# Patient Record
Sex: Female | Born: 1965 | Race: White | Hispanic: No | Marital: Married | Smoking: Never smoker
Health system: Southern US, Community
[De-identification: ages and names within clinical notes are randomized; demographics above are authoritative.]

## PROBLEM LIST (undated history)

## (undated) DIAGNOSIS — R112 Nausea with vomiting, unspecified: Secondary | ICD-10-CM

## (undated) DIAGNOSIS — Z9889 Other specified postprocedural states: Secondary | ICD-10-CM

## (undated) DIAGNOSIS — F419 Anxiety disorder, unspecified: Secondary | ICD-10-CM

## (undated) DIAGNOSIS — L309 Dermatitis, unspecified: Secondary | ICD-10-CM

## (undated) HISTORY — DX: Nausea with vomiting, unspecified: R11.2

## (undated) HISTORY — PX: REDUCTION MAMMAPLASTY: SUR839

## (undated) HISTORY — DX: Dermatitis, unspecified: L30.9

## (undated) HISTORY — DX: Other specified postprocedural states: Z98.890

## (undated) HISTORY — DX: Anxiety disorder, unspecified: F41.9

---

## 1988-04-09 HISTORY — PX: BREAST SURGERY: SHX581

## 1999-01-26 ENCOUNTER — Other Ambulatory Visit: Admission: RE | Admit: 1999-01-26 | Discharge: 1999-01-26 | Payer: Self-pay | Admitting: Gynecology

## 2000-03-15 ENCOUNTER — Other Ambulatory Visit: Admission: RE | Admit: 2000-03-15 | Discharge: 2000-03-15 | Payer: Self-pay | Admitting: Obstetrics and Gynecology

## 2001-03-27 ENCOUNTER — Other Ambulatory Visit: Admission: RE | Admit: 2001-03-27 | Discharge: 2001-03-27 | Payer: Self-pay | Admitting: Gynecology

## 2002-04-07 ENCOUNTER — Other Ambulatory Visit: Admission: RE | Admit: 2002-04-07 | Discharge: 2002-04-07 | Payer: Self-pay | Admitting: Gynecology

## 2003-04-19 ENCOUNTER — Other Ambulatory Visit: Admission: RE | Admit: 2003-04-19 | Discharge: 2003-04-19 | Payer: Self-pay | Admitting: Gynecology

## 2003-04-27 ENCOUNTER — Encounter: Admission: RE | Admit: 2003-04-27 | Discharge: 2003-04-27 | Payer: Self-pay | Admitting: Gynecology

## 2004-04-19 ENCOUNTER — Other Ambulatory Visit: Admission: RE | Admit: 2004-04-19 | Discharge: 2004-04-19 | Payer: Self-pay | Admitting: Gynecology

## 2005-04-20 ENCOUNTER — Other Ambulatory Visit: Admission: RE | Admit: 2005-04-20 | Discharge: 2005-04-20 | Payer: Self-pay | Admitting: Gynecology

## 2006-04-30 ENCOUNTER — Other Ambulatory Visit: Admission: RE | Admit: 2006-04-30 | Discharge: 2006-04-30 | Payer: Self-pay | Admitting: Gynecology

## 2007-05-15 ENCOUNTER — Other Ambulatory Visit: Admission: RE | Admit: 2007-05-15 | Discharge: 2007-05-15 | Payer: Self-pay | Admitting: Gynecology

## 2007-07-03 ENCOUNTER — Encounter: Admission: RE | Admit: 2007-07-03 | Discharge: 2007-07-03 | Payer: Self-pay | Admitting: Gynecology

## 2008-05-03 ENCOUNTER — Encounter: Payer: Self-pay | Admitting: Women's Health

## 2008-05-03 ENCOUNTER — Ambulatory Visit: Payer: Self-pay | Admitting: Women's Health

## 2008-05-03 ENCOUNTER — Other Ambulatory Visit: Admission: RE | Admit: 2008-05-03 | Discharge: 2008-05-03 | Payer: Self-pay | Admitting: Gynecology

## 2008-07-09 ENCOUNTER — Encounter: Admission: RE | Admit: 2008-07-09 | Discharge: 2008-07-09 | Payer: Self-pay | Admitting: Gynecology

## 2009-05-13 ENCOUNTER — Other Ambulatory Visit: Admission: RE | Admit: 2009-05-13 | Discharge: 2009-05-13 | Payer: Self-pay | Admitting: Gynecology

## 2009-05-13 ENCOUNTER — Ambulatory Visit: Payer: Self-pay | Admitting: Women's Health

## 2010-03-24 ENCOUNTER — Encounter
Admission: RE | Admit: 2010-03-24 | Discharge: 2010-03-24 | Payer: Self-pay | Source: Home / Self Care | Attending: Gynecology | Admitting: Gynecology

## 2010-04-30 ENCOUNTER — Encounter: Payer: Self-pay | Admitting: Gynecology

## 2010-06-12 ENCOUNTER — Other Ambulatory Visit: Payer: Self-pay | Admitting: Women's Health

## 2010-06-12 ENCOUNTER — Other Ambulatory Visit (HOSPITAL_COMMUNITY)
Admission: RE | Admit: 2010-06-12 | Discharge: 2010-06-12 | Disposition: A | Discharge status: Home / Self Care | Payer: 59 | Source: Ambulatory Visit | Attending: Gynecology | Admitting: Gynecology

## 2010-06-12 ENCOUNTER — Encounter (INDEPENDENT_AMBULATORY_CARE_PROVIDER_SITE_OTHER): Payer: 59 | Admitting: Women's Health

## 2010-06-12 DIAGNOSIS — Z13220 Encounter for screening for lipoid disorders: Secondary | ICD-10-CM

## 2010-06-12 DIAGNOSIS — Z01419 Encounter for gynecological examination (general) (routine) without abnormal findings: Secondary | ICD-10-CM

## 2010-06-12 DIAGNOSIS — Z124 Encounter for screening for malignant neoplasm of cervix: Secondary | ICD-10-CM | POA: Insufficient documentation

## 2010-06-12 DIAGNOSIS — Z833 Family history of diabetes mellitus: Secondary | ICD-10-CM

## 2011-04-19 ENCOUNTER — Other Ambulatory Visit: Payer: Self-pay | Admitting: Obstetrics and Gynecology

## 2011-04-19 DIAGNOSIS — Z1231 Encounter for screening mammogram for malignant neoplasm of breast: Secondary | ICD-10-CM

## 2011-05-11 ENCOUNTER — Ambulatory Visit
Admission: RE | Admit: 2011-05-11 | Discharge: 2011-05-11 | Disposition: A | Discharge status: Home / Self Care | Payer: 59 | Source: Ambulatory Visit | Attending: Obstetrics and Gynecology | Admitting: Obstetrics and Gynecology

## 2011-05-11 DIAGNOSIS — Z1231 Encounter for screening mammogram for malignant neoplasm of breast: Secondary | ICD-10-CM

## 2011-06-20 ENCOUNTER — Ambulatory Visit (INDEPENDENT_AMBULATORY_CARE_PROVIDER_SITE_OTHER): Payer: 59 | Admitting: Women's Health

## 2011-06-20 ENCOUNTER — Other Ambulatory Visit (HOSPITAL_COMMUNITY)
Admission: RE | Admit: 2011-06-20 | Discharge: 2011-06-20 | Disposition: A | Discharge status: Home / Self Care | Payer: 59 | Source: Ambulatory Visit | Attending: Obstetrics and Gynecology | Admitting: Obstetrics and Gynecology

## 2011-06-20 ENCOUNTER — Encounter: Payer: Self-pay | Admitting: Women's Health

## 2011-06-20 VITALS — BP 118/70 | Ht 63.0 in | Wt 143.0 lb

## 2011-06-20 DIAGNOSIS — Z01419 Encounter for gynecological examination (general) (routine) without abnormal findings: Secondary | ICD-10-CM | POA: Insufficient documentation

## 2011-06-20 DIAGNOSIS — Z13220 Encounter for screening for lipoid disorders: Secondary | ICD-10-CM

## 2011-06-20 DIAGNOSIS — Z833 Family history of diabetes mellitus: Secondary | ICD-10-CM

## 2011-06-20 DIAGNOSIS — Z309 Encounter for contraceptive management, unspecified: Secondary | ICD-10-CM

## 2011-06-20 DIAGNOSIS — IMO0001 Reserved for inherently not codable concepts without codable children: Secondary | ICD-10-CM

## 2011-06-20 LAB — CBC WITH DIFFERENTIAL/PLATELET
Basophils Absolute: 0.1 10*3/uL (ref 0.0–0.1)
Basophils Relative: 1 % (ref 0–1)
Eosinophils Absolute: 0.2 10*3/uL (ref 0.0–0.7)
Eosinophils Relative: 2 % (ref 0–5)
HCT: 43.3 % (ref 36.0–46.0)
Hemoglobin: 14.3 g/dL (ref 12.0–15.0)
Lymphocytes Relative: 25 % (ref 12–46)
Lymphs Abs: 2.4 10*3/uL (ref 0.7–4.0)
MCH: 31.3 pg (ref 26.0–34.0)
MCHC: 33 g/dL (ref 30.0–36.0)
MCV: 94.7 fL (ref 78.0–100.0)
Monocytes Absolute: 0.5 10*3/uL (ref 0.1–1.0)
Monocytes Relative: 5 % (ref 3–12)
Neutro Abs: 6.4 10*3/uL (ref 1.7–7.7)
Neutrophils Relative %: 67 % (ref 43–77)
Platelets: 277 10*3/uL (ref 150–400)
RBC: 4.57 MIL/uL (ref 3.87–5.11)
RDW: 12.6 % (ref 11.5–15.5)
WBC: 9.6 10*3/uL (ref 4.0–10.5)

## 2011-06-20 LAB — LIPID PANEL
Cholesterol: 201 mg/dL — ABNORMAL HIGH (ref 0–200)
HDL: 78 mg/dL (ref >39)
LDL Cholesterol: 102 mg/dL — ABNORMAL HIGH (ref 0–99)
Total CHOL/HDL Ratio: 2.6 Ratio
Triglycerides: 106 mg/dL (ref <150)
VLDL: 21 mg/dL (ref 0–40)

## 2011-06-20 LAB — GLUCOSE, RANDOM: Glucose, Bld: 93 mg/dL (ref 70–99)

## 2011-06-20 MED ORDER — NORETHINDRONE ACET-ETHINYL EST 1-20 MG-MCG PO TABS: 1.0000 | ORAL_TABLET | Freq: Every day | ORAL | Status: DC

## 2011-06-20 NOTE — Progress Notes (Signed)
Leslie Tate 44-Jun-1967 [id number]    History:    The patient presents for annual exam.  Light monthly cycle on Loestrin 1/20 without complaint. History of normal Paps and mammograms.   Past medical history, past surgical history, family history and social history were all reviewed and documented in the EPIC chart. Works for [employer].   ROS:  A  ROS was performed and pertinent positives and negatives are included in the history.  Exam:  Filed Vitals:   06/20/11 0810  BP: 118/70    General appearance:  Normal Head/Neck:  Normal, without cervical or supraclavicular adenopathy. Thyroid:  Symmetrical, normal in size, without palpable masses or nodularity. Respiratory  Effort:  Normal  Auscultation:  Clear without wheezing or rhonchi Cardiovascular  Auscultation:  Regular rate, without rubs, murmurs or gallops  Edema/varicosities:  Not grossly evident Abdominal  Soft,nontender, without masses, guarding or rebound.  Liver/spleen:  No organomegaly noted  Hernia:  None appreciated  Skin  Inspection:  Grossly normal  Palpation:  Grossly normal Neurologic/psychiatric  Orientation:  Normal with appropriate conversation.  Mood/affect:  Normal  Genitourinary    Breasts: Examined lying and sitting/breast reduction.     Right: Without masses, retractions, discharge or axillary adenopathy.     Left: Without masses, retractions, discharge or axillary adenopathy.   Inguinal/mons:  Normal without inguinal adenopathy  External genitalia:  Normal  BUS/Urethra/Skene's glands:  Normal  Bladder:  Normal  Vagina:  Normal  Cervix:  Normal  Uterus:  normal in size, shape and contour.  Midline and mobile  Adnexa/parametria:     Rt: Without masses or tenderness.   Lt: Without masses or tenderness.  Anus and perineum: Normal  Digital rectal exam: Normal sphincter tone without palpated masses or tenderness  Assessment/Plan:  46 y.o. MWF G0 for annual exam without complaint.  Normal  GYN exam on Loestrin 1/20.  Plan: Loestrin 1/20 prescription, proper use, slight risk for blood clots and strokes reviewed. SBE's, annual mammogram, calcium rich diet, exercise encouraged. CBC, glucose, lipid profile, UA.  ACOG's recommendation is for Pap screening reviewed.   Harrington Challenger WHNP, 1:48 PM 06/20/2011

## 2011-06-20 NOTE — Patient Instructions (Signed)

## 2011-06-21 LAB — URINALYSIS W MICROSCOPIC + REFLEX CULTURE
Bacteria, UA: NONE SEEN
Bilirubin Urine: NEGATIVE
Casts: NONE SEEN
Crystals: NONE SEEN
Glucose, UA: NEGATIVE mg/dL
Hgb urine dipstick: NEGATIVE
Ketones, ur: NEGATIVE mg/dL
Leukocytes, UA: NEGATIVE
Nitrite: NEGATIVE
Protein, ur: NEGATIVE mg/dL
Specific Gravity, Urine: 1.019 (ref 1.005–1.030)
Squamous Epithelial / HPF: NONE SEEN
Urobilinogen, UA: 0.2 mg/dL (ref 0.0–1.0)
pH: 6 (ref 5.0–8.0)

## 2011-07-11 ENCOUNTER — Other Ambulatory Visit: Payer: Self-pay | Admitting: Women's Health

## 2012-07-11 ENCOUNTER — Other Ambulatory Visit: Payer: Self-pay

## 2012-07-11 ENCOUNTER — Other Ambulatory Visit: Payer: Self-pay | Admitting: Women's Health

## 2012-07-11 DIAGNOSIS — Z1231 Encounter for screening mammogram for malignant neoplasm of breast: Secondary | ICD-10-CM

## 2012-07-17 ENCOUNTER — Encounter: Payer: Self-pay | Admitting: Women's Health

## 2012-07-17 ENCOUNTER — Ambulatory Visit (INDEPENDENT_AMBULATORY_CARE_PROVIDER_SITE_OTHER): Payer: 59 | Admitting: Women's Health

## 2012-07-17 VITALS — BP 108/72 | Ht 63.25 in | Wt 145.0 lb

## 2012-07-17 DIAGNOSIS — Z01419 Encounter for gynecological examination (general) (routine) without abnormal findings: Secondary | ICD-10-CM

## 2012-07-17 DIAGNOSIS — Z309 Encounter for contraceptive management, unspecified: Secondary | ICD-10-CM

## 2012-07-17 DIAGNOSIS — IMO0001 Reserved for inherently not codable concepts without codable children: Secondary | ICD-10-CM

## 2012-07-17 DIAGNOSIS — Z13220 Encounter for screening for lipoid disorders: Secondary | ICD-10-CM

## 2012-07-17 DIAGNOSIS — Z833 Family history of diabetes mellitus: Secondary | ICD-10-CM

## 2012-07-17 LAB — CBC WITH DIFFERENTIAL/PLATELET
Basophils Absolute: 0 K/uL (ref 0.0–0.1)
Basophils Relative: 0 % (ref 0–1)
Eosinophils Absolute: 0.1 K/uL (ref 0.0–0.7)
Eosinophils Relative: 1 % (ref 0–5)
HCT: 40.7 % (ref 36.0–46.0)
Hemoglobin: 13.8 g/dL (ref 12.0–15.0)
Lymphocytes Relative: 25 % (ref 12–46)
Lymphs Abs: 2.5 K/uL (ref 0.7–4.0)
MCH: 31 pg (ref 26.0–34.0)
MCHC: 33.9 g/dL (ref 30.0–36.0)
MCV: 91.5 fL (ref 78.0–100.0)
Monocytes Absolute: 0.3 K/uL (ref 0.1–1.0)
Monocytes Relative: 3 % (ref 3–12)
Neutro Abs: 7 K/uL (ref 1.7–7.7)
Neutrophils Relative %: 71 % (ref 43–77)
Platelets: 269 K/uL (ref 150–400)
RBC: 4.45 MIL/uL (ref 3.87–5.11)
RDW: 13.1 % (ref 11.5–15.5)
WBC: 9.9 K/uL (ref 4.0–10.5)

## 2012-07-17 LAB — LIPID PANEL
Cholesterol: 197 mg/dL (ref 0–200)
HDL: 64 mg/dL
LDL Cholesterol: 111 mg/dL — ABNORMAL HIGH (ref 0–99)
Total CHOL/HDL Ratio: 3.1 ratio
Triglycerides: 110 mg/dL
VLDL: 22 mg/dL (ref 0–40)

## 2012-07-17 LAB — GLUCOSE, RANDOM: Glucose, Bld: 87 mg/dL (ref 70–99)

## 2012-07-17 MED ORDER — NORETHIN ACE-ETH ESTRAD-FE 1-20 MG-MCG PO TABS: 1.0000 | ORAL_TABLET | Freq: Every day | ORAL | Status: DC

## 2012-07-17 NOTE — Progress Notes (Signed)
Leslie Tate [birth year] [id number]    History:    The patient presents for annual exam.  Light monthly cycle on Loestrin 1/20 without complaint. Normal Pap and mammogram history. Mammogram scheduled in May. History of her breast reduction.   Past medical history, past surgical history, family history and social history were all reviewed and documented in the EPIC chart. Works at [employer], has 2 yellow labs. Adopted.   ROS:  A  ROS was performed and pertinent positives and negatives are included in the history.  Exam:  Filed Vitals:   07/17/12 0812  BP: 108/72    General appearance:  Normal Head/Neck:  Normal, without cervical or supraclavicular adenopathy. Thyroid:  Symmetrical, normal in size, without palpable masses or nodularity. Respiratory  Effort:  Normal  Auscultation:  Clear without wheezing or rhonchi Cardiovascular  Auscultation:  Regular rate, without rubs, murmurs or gallops  Edema/varicosities:  Not grossly evident Abdominal  Soft,nontender, without masses, guarding or rebound.  Liver/spleen:  No organomegaly noted  Hernia:  None appreciated  Skin  Inspection:  Grossly normal  Palpation:  Grossly normal Neurologic/psychiatric  Orientation:  Normal with appropriate conversation.  Mood/affect:  Normal  Genitourinary    Breasts: Examined lying and sitting/reduction.     Right: Without masses, retractions, discharge or axillary adenopathy.     Left: Without masses, retractions, discharge or axillary adenopathy.   Inguinal/mons:  Normal without inguinal adenopathy  External genitalia:  Normal  BUS/Urethra/Skene's glands:  Normal  Bladder:  Normal  Vagina:  Normal  Cervix:  Normal  Uterus:   normal in size, shape and contour.  Midline and mobile  Adnexa/parametria:     Rt: Without masses or tenderness.   Lt: Without masses or tenderness.  Anus and perineum: Normal  Digital rectal exam: Normal sphincter tone without palpated masses or  tenderness  Assessment/Plan:  47 y.o. MWF G0 for annual exam with no complaints.  Normal GYN exam on Loestrin  Plan: Loestrin 1/20 prescription, proper use, slight risk for blood clots and strokes reviewed. SBE's, continue annual mammogram, calcium rich diet, continue active lifestyle walking and using elliptical, vitamin D 1000 daily encouraged. CBC, glucose, UA.  Pap normal 2013, new screening guidelines reviewed.    Harrington Challenger [facility], 8:53 AM 07/17/2012

## 2012-07-17 NOTE — Patient Instructions (Signed)

## 2012-07-18 LAB — URINALYSIS W MICROSCOPIC + REFLEX CULTURE
Bilirubin Urine: NEGATIVE
Casts: NONE SEEN
Crystals: NONE SEEN
Glucose, UA: NEGATIVE mg/dL
Hgb urine dipstick: NEGATIVE
Ketones, ur: NEGATIVE mg/dL
Leukocytes, UA: NEGATIVE
Nitrite: NEGATIVE
Protein, ur: NEGATIVE mg/dL
Specific Gravity, Urine: <1.005 — ABNORMAL LOW (ref 1.005–1.030)
Squamous Epithelial / HPF: NONE SEEN
Urobilinogen, UA: 0.2 mg/dL (ref 0.0–1.0)
pH: 6.5 (ref 5.0–8.0)

## 2012-08-08 ENCOUNTER — Ambulatory Visit: Payer: 59

## 2012-08-29 ENCOUNTER — Ambulatory Visit
Admission: RE | Admit: 2012-08-29 | Discharge: 2012-08-29 | Disposition: A | Discharge status: Home / Self Care | Payer: 59 | Source: Ambulatory Visit

## 2012-08-29 DIAGNOSIS — Z1231 Encounter for screening mammogram for malignant neoplasm of breast: Secondary | ICD-10-CM

## 2012-12-23 ENCOUNTER — Other Ambulatory Visit: Payer: Self-pay | Admitting: Women's Health

## 2013-09-02 ENCOUNTER — Encounter: Payer: Self-pay | Admitting: Women's Health

## 2013-09-02 ENCOUNTER — Ambulatory Visit (INDEPENDENT_AMBULATORY_CARE_PROVIDER_SITE_OTHER): Payer: 59 | Admitting: Women's Health

## 2013-09-02 VITALS — BP 102/70 | Ht 63.0 in | Wt 146.0 lb

## 2013-09-02 DIAGNOSIS — Z833 Family history of diabetes mellitus: Secondary | ICD-10-CM

## 2013-09-02 DIAGNOSIS — Z309 Encounter for contraceptive management, unspecified: Secondary | ICD-10-CM

## 2013-09-02 DIAGNOSIS — Z13220 Encounter for screening for lipoid disorders: Secondary | ICD-10-CM

## 2013-09-02 DIAGNOSIS — Z01419 Encounter for gynecological examination (general) (routine) without abnormal findings: Secondary | ICD-10-CM

## 2013-09-02 DIAGNOSIS — IMO0001 Reserved for inherently not codable concepts without codable children: Secondary | ICD-10-CM

## 2013-09-02 LAB — CBC WITH DIFFERENTIAL/PLATELET
Basophils Absolute: 0 10*3/uL (ref 0.0–0.1)
Basophils Relative: 0 % (ref 0–1)
Eosinophils Absolute: 0.2 10*3/uL (ref 0.0–0.7)
Eosinophils Relative: 2 % (ref 0–5)
HCT: 41.6 % (ref 36.0–46.0)
Hemoglobin: 14.1 g/dL (ref 12.0–15.0)
Lymphocytes Relative: 27 % (ref 12–46)
Lymphs Abs: 2.4 10*3/uL (ref 0.7–4.0)
MCH: 30.9 pg (ref 26.0–34.0)
MCHC: 33.9 g/dL (ref 30.0–36.0)
MCV: 91.2 fL (ref 78.0–100.0)
Monocytes Absolute: 0.4 10*3/uL (ref 0.1–1.0)
Monocytes Relative: 5 % (ref 3–12)
Neutro Abs: 5.9 10*3/uL (ref 1.7–7.7)
Neutrophils Relative %: 66 % (ref 43–77)
Platelets: 298 10*3/uL (ref 150–400)
RBC: 4.56 MIL/uL (ref 3.87–5.11)
RDW: 13.1 % (ref 11.5–15.5)
WBC: 8.9 10*3/uL (ref 4.0–10.5)

## 2013-09-02 MED ORDER — NORETHIN ACE-ETH ESTRAD-FE 1-20 MG-MCG PO TABS: ORAL_TABLET | ORAL | Status: DC

## 2013-09-02 NOTE — Patient Instructions (Signed)

## 2013-09-02 NOTE — Progress Notes (Signed)
Leslie Tate 30-Mar-1966 [id number]    History:    Presents for annual exam.  Regular monthly cycle on Loestrin. Normal Pap and mammogram history.  Past medical history, past surgical history, family history and social history were all reviewed and documented in the EPIC chart. Adopted, unknown family history. Works for [employer]. Has 2 labs.  ROS:  A  12 point ROS was performed and pertinent positives and negatives are included.  Exam:  Filed Vitals:   09/02/13 0807  BP: 102/70    General appearance:  Normal Thyroid:  Symmetrical, normal in size, without palpable masses or nodularity. Respiratory  Auscultation:  Clear without wheezing or rhonchi Cardiovascular  Auscultation:  Regular rate, without rubs, murmurs or gallops  Edema/varicosities:  Not grossly evident Abdominal  Soft,nontender, without masses, guarding or rebound.  Liver/spleen:  No organomegaly noted  Hernia:  None appreciated  Skin  Inspection:  Grossly normal   Breasts: Examined lying and sitting/bilateral breast reduction.     Right: Without masses, retractions, discharge or axillary adenopathy.     Left: Without masses, retractions, discharge or axillary adenopathy. Gentitourinary   Inguinal/mons:  Normal without inguinal adenopathy  External genitalia:  Normal  BUS/Urethra/Skene's glands:  Normal  Vagina:  Normal  Cervix:  Normal  Uterus:   normal in size, shape and contour.  Midline and mobile  Adnexa/parametria:     Rt: Without masses or tenderness.   Lt: Without masses or tenderness.  Anus and perineum: Normal  Digital rectal exam: Normal sphincter tone without palpated masses or tenderness  Assessment/Plan:  48 y.o. MWF G0 for annual exam with no complaints.  Normal GYN exam on Loestrin Adopted  Plan: Loestrin 1/20 prescription, proper use, slight risk for blood clots and strokes reviewed. SBE's, continue annual mammogram, 3D tomography history of breast reduction. Regular exercise,  calcium rich diet, vitamin D 1000 daily encouraged. CBC, glucose, lipid panel, UA,. Pap normal 2013, new screening guidelines reviewed.  Note: This dictation was prepared with Dragon/digital dictation.  Any transcriptional errors that result are unintentional. [place], 4:15 PM 09/02/2013

## 2013-09-03 LAB — LIPID PANEL
Cholesterol: 192 mg/dL (ref 0–200)
HDL: 64 mg/dL (ref >39)
LDL Cholesterol: 96 mg/dL (ref 0–99)
Total CHOL/HDL Ratio: 3 Ratio
Triglycerides: 159 mg/dL — ABNORMAL HIGH (ref <150)
VLDL: 32 mg/dL (ref 0–40)

## 2013-09-03 LAB — URINALYSIS W MICROSCOPIC + REFLEX CULTURE
Bacteria, UA: NONE SEEN
Bilirubin Urine: NEGATIVE
Casts: NONE SEEN
Crystals: NONE SEEN
Glucose, UA: NEGATIVE mg/dL
Hgb urine dipstick: NEGATIVE
Ketones, ur: NEGATIVE mg/dL
Leukocytes, UA: NEGATIVE
Nitrite: NEGATIVE
Protein, ur: NEGATIVE mg/dL
Specific Gravity, Urine: <1.005 — ABNORMAL LOW (ref 1.005–1.030)
Squamous Epithelial / HPF: NONE SEEN
Urobilinogen, UA: 0.2 mg/dL (ref 0.0–1.0)
pH: 6.5 (ref 5.0–8.0)

## 2013-09-03 LAB — GLUCOSE, RANDOM: Glucose, Bld: 100 mg/dL — ABNORMAL HIGH (ref 70–99)

## 2013-09-11 ENCOUNTER — Other Ambulatory Visit: Payer: Self-pay | Admitting: Women's Health

## 2013-10-12 ENCOUNTER — Other Ambulatory Visit: Payer: Self-pay

## 2013-10-12 DIAGNOSIS — Z1231 Encounter for screening mammogram for malignant neoplasm of breast: Secondary | ICD-10-CM

## 2013-10-29 ENCOUNTER — Encounter (INDEPENDENT_AMBULATORY_CARE_PROVIDER_SITE_OTHER): Payer: Self-pay

## 2013-10-29 ENCOUNTER — Ambulatory Visit
Admission: RE | Admit: 2013-10-29 | Discharge: 2013-10-29 | Disposition: A | Discharge status: Home / Self Care | Payer: 59 | Source: Ambulatory Visit

## 2013-10-29 DIAGNOSIS — Z1231 Encounter for screening mammogram for malignant neoplasm of breast: Secondary | ICD-10-CM

## 2014-09-15 ENCOUNTER — Encounter: Payer: Self-pay | Admitting: Women's Health

## 2014-09-15 ENCOUNTER — Other Ambulatory Visit (HOSPITAL_COMMUNITY)
Admission: RE | Admit: 2014-09-15 | Discharge: 2014-09-15 | Disposition: A | Discharge status: Home / Self Care | Payer: 59 | Source: Ambulatory Visit | Attending: Women's Health | Admitting: Women's Health

## 2014-09-15 ENCOUNTER — Ambulatory Visit (INDEPENDENT_AMBULATORY_CARE_PROVIDER_SITE_OTHER): Payer: 59 | Admitting: Women's Health

## 2014-09-15 VITALS — BP 110/72 | Ht 63.0 in | Wt 142.6 lb

## 2014-09-15 DIAGNOSIS — Z13220 Encounter for screening for lipoid disorders: Secondary | ICD-10-CM

## 2014-09-15 DIAGNOSIS — Z1151 Encounter for screening for human papillomavirus (HPV): Secondary | ICD-10-CM | POA: Diagnosis present

## 2014-09-15 DIAGNOSIS — Z01419 Encounter for gynecological examination (general) (routine) without abnormal findings: Secondary | ICD-10-CM | POA: Insufficient documentation

## 2014-09-15 DIAGNOSIS — Z3041 Encounter for surveillance of contraceptive pills: Secondary | ICD-10-CM

## 2014-09-15 MED ORDER — NORETHIN ACE-ETH ESTRAD-FE 1-20 MG-MCG PO TABS: ORAL_TABLET | ORAL | Status: DC

## 2014-09-15 NOTE — Patient Instructions (Signed)

## 2014-09-15 NOTE — Addendum Note (Signed)
Addended by: Thamas Jaegers on: 09/15/2014 08:49 AM   Modules accepted: Orders

## 2014-09-15 NOTE — Progress Notes (Signed)
Leslie Tate 1965-08-23 [id number]    History:    Presents for annual exam.  Light monthly cycle on Loestrin. Normal Pap and mammogram history has had 3-D mammograms.  Past medical history, past surgical history, family history and social history were all reviewed and documented in the EPIC chart. Adopted unknown family history. Works at [employer] in [place]. 2 labs.  ROS:  A ROS was performed and pertinent positives and negatives are included.  Exam:  Filed Vitals:   09/15/14 0813  BP: 110/72    General appearance:  Normal Thyroid:  Symmetrical, normal in size, without palpable masses or nodularity. Respiratory  Auscultation:  Clear without wheezing or rhonchi Cardiovascular  Auscultation:  Regular rate, without rubs, murmurs or gallops  Edema/varicosities:  Not grossly evident Abdominal  Soft,nontender, without masses, guarding or rebound.  Liver/spleen:  No organomegaly noted  Hernia:  None appreciated  Skin  Inspection:  Grossly normal   Breasts: Examined lying and sitting.     Right: Without masses, retractions, discharge or axillary adenopathy.     Left: Without masses, retractions, discharge or axillary adenopathy. Gentitourinary   Inguinal/mons:  Normal without inguinal adenopathy  External genitalia:  Normal  BUS/Urethra/Skene's glands:  Normal  Vagina:  Normal  Cervix:  Normal  Uterus:   normal in size, shape and contour.  Midline and mobile  Adnexa/parametria:     Rt: Without masses or tenderness.   Lt: Without masses or tenderness.  Anus and perineum: Normal  Digital rectal exam: Normal sphincter tone without palpated masses or tenderness  Assessment/Plan:  49 y.o. MWF G0 for annual exam with no complaints.  Light monthly cycle on Loestrin.  Plan: Loestrin 1/20 prescription, proper use, slight risk for blood clots and strokes. SBE's, continue annual 3-D screening mammogram. Continue healthy lifestyle of diet and exercise, calcium rich diet,  vitamin D 1000 daily encouraged. CBC, CMP, lipid panel, UA, Pap with HR HPV typing, new screening guidelines reviewed.  Huel Cote [facility], 8:41 AM 09/15/2014

## 2014-09-15 NOTE — Addendum Note (Signed)
Addended by: Thamas Jaegers on: 09/15/2014 08:47 AM   Modules accepted: Orders

## 2014-09-16 LAB — CBC WITH DIFFERENTIAL/PLATELET
BASOS PCT: 1 % (ref 0–1)
Basophils Absolute: 0.1 10*3/uL (ref 0.0–0.1)
Eosinophils Absolute: 0.2 10*3/uL (ref 0.0–0.7)
Eosinophils Relative: 3 % (ref 0–5)
HCT: 41.9 % (ref 36.0–46.0)
Hemoglobin: 13.8 g/dL (ref 12.0–15.0)
Lymphocytes Relative: 31 % (ref 12–46)
Lymphs Abs: 2.4 10*3/uL (ref 0.7–4.0)
MCH: 30.6 pg (ref 26.0–34.0)
MCHC: 32.9 g/dL (ref 30.0–36.0)
MCV: 92.9 fL (ref 78.0–100.0)
MONOS PCT: 5 % (ref 3–12)
MPV: 9.9 fL (ref 8.6–12.4)
Monocytes Absolute: 0.4 10*3/uL (ref 0.1–1.0)
Neutro Abs: 4.6 10*3/uL (ref 1.7–7.7)
Neutrophils Relative %: 60 % (ref 43–77)
Platelets: 272 10*3/uL (ref 150–400)
RBC: 4.51 MIL/uL (ref 3.87–5.11)
RDW: 12.7 % (ref 11.5–15.5)
WBC: 7.7 10*3/uL (ref 4.0–10.5)

## 2014-09-16 LAB — CYTOLOGY - PAP

## 2014-09-16 LAB — URINALYSIS W MICROSCOPIC + REFLEX CULTURE
Bacteria, UA: NONE SEEN
Bilirubin Urine: NEGATIVE
CASTS: NONE SEEN
CRYSTALS: NONE SEEN
GLUCOSE, UA: NEGATIVE mg/dL
HGB URINE DIPSTICK: NEGATIVE
Ketones, ur: NEGATIVE mg/dL
Leukocytes, UA: NEGATIVE
Nitrite: NEGATIVE
PH: 6 (ref 5.0–8.0)
PROTEIN: NEGATIVE mg/dL
SPECIFIC GRAVITY, URINE: 1.009 (ref 1.005–1.030)
Squamous Epithelial / HPF: NONE SEEN
Urobilinogen, UA: 0.2 mg/dL (ref 0.0–1.0)

## 2014-09-17 LAB — LIPID PANEL
CHOL/HDL RATIO: 3 ratio
Cholesterol: 190 mg/dL (ref 0–200)
HDL: 63 mg/dL (ref >=46)
LDL CALC: 104 mg/dL — AB (ref 0–99)
Triglycerides: 115 mg/dL (ref <150)
VLDL: 23 mg/dL (ref 0–40)

## 2014-09-17 LAB — COMPREHENSIVE METABOLIC PANEL
ALT: 20 U/L (ref 0–35)
AST: 19 U/L (ref 0–37)
Albumin: 4.2 g/dL (ref 3.5–5.2)
Alkaline Phosphatase: 51 U/L (ref 39–117)
BUN: 11 mg/dL (ref 6–23)
CO2: 25 mEq/L (ref 19–32)
Calcium: 9 mg/dL (ref 8.4–10.5)
Chloride: 105 mEq/L (ref 96–112)
Creat: 0.63 mg/dL (ref 0.50–1.10)
Glucose, Bld: 85 mg/dL (ref 70–99)
Potassium: 4.4 mEq/L (ref 3.5–5.3)
Sodium: 137 mEq/L (ref 135–145)
Total Bilirubin: 1 mg/dL (ref 0.2–1.2)
Total Protein: 6.7 g/dL (ref 6.0–8.3)

## 2014-10-04 ENCOUNTER — Other Ambulatory Visit: Payer: Self-pay

## 2014-10-04 DIAGNOSIS — Z1231 Encounter for screening mammogram for malignant neoplasm of breast: Secondary | ICD-10-CM

## 2014-11-05 ENCOUNTER — Other Ambulatory Visit: Payer: Self-pay

## 2014-11-05 DIAGNOSIS — Z3041 Encounter for surveillance of contraceptive pills: Secondary | ICD-10-CM

## 2014-11-05 MED ORDER — NORETHIN ACE-ETH ESTRAD-FE 1-20 MG-MCG PO TABS: ORAL_TABLET | ORAL | Status: DC

## 2014-11-09 ENCOUNTER — Ambulatory Visit
Admission: RE | Admit: 2014-11-09 | Discharge: 2014-11-09 | Disposition: A | Discharge status: Home / Self Care | Payer: 59 | Source: Ambulatory Visit

## 2014-11-09 DIAGNOSIS — Z1231 Encounter for screening mammogram for malignant neoplasm of breast: Secondary | ICD-10-CM

## 2015-03-30 ENCOUNTER — Ambulatory Visit: Payer: 59

## 2015-09-29 ENCOUNTER — Other Ambulatory Visit: Payer: Self-pay

## 2015-09-29 DIAGNOSIS — Z3041 Encounter for surveillance of contraceptive pills: Secondary | ICD-10-CM

## 2015-09-29 MED ORDER — NORETHIN ACE-ETH ESTRAD-FE 1-20 MG-MCG PO TABS: ORAL_TABLET | ORAL | Status: DC

## 2015-10-26 ENCOUNTER — Encounter: Payer: Self-pay | Admitting: Women's Health

## 2015-10-26 ENCOUNTER — Ambulatory Visit (INDEPENDENT_AMBULATORY_CARE_PROVIDER_SITE_OTHER): Payer: 59 | Admitting: Women's Health

## 2015-10-26 VITALS — BP 110/80 | Ht 63.0 in | Wt 133.0 lb

## 2015-10-26 DIAGNOSIS — Z1329 Encounter for screening for other suspected endocrine disorder: Secondary | ICD-10-CM | POA: Diagnosis not present

## 2015-10-26 DIAGNOSIS — Z13220 Encounter for screening for lipoid disorders: Secondary | ICD-10-CM

## 2015-10-26 DIAGNOSIS — Z3041 Encounter for surveillance of contraceptive pills: Secondary | ICD-10-CM

## 2015-10-26 DIAGNOSIS — Z01419 Encounter for gynecological examination (general) (routine) without abnormal findings: Secondary | ICD-10-CM

## 2015-10-26 LAB — CBC WITH DIFFERENTIAL/PLATELET
Basophils Absolute: 0 cells/uL (ref 0–200)
Basophils Relative: 0 %
EOS PCT: 1 %
Eosinophils Absolute: 107 cells/uL (ref 15–500)
HCT: 44.7 % (ref 35.0–45.0)
HEMOGLOBIN: 15 g/dL (ref 11.7–15.5)
Lymphocytes Relative: 16 %
Lymphs Abs: 1712 cells/uL (ref 850–3900)
MCH: 31.4 pg (ref 27.0–33.0)
MCHC: 33.6 g/dL (ref 32.0–36.0)
MCV: 93.5 fL (ref 80.0–100.0)
MONOS PCT: 5 %
MPV: 10.1 fL (ref 7.5–12.5)
Monocytes Absolute: 535 cells/uL (ref 200–950)
NEUTROS ABS: 8346 {cells}/uL — AB (ref 1500–7800)
Neutrophils Relative %: 78 %
PLATELETS: 264 10*3/uL (ref 140–400)
RBC: 4.78 MIL/uL (ref 3.80–5.10)
RDW: 13.2 % (ref 11.0–15.0)
WBC: 10.7 10*3/uL (ref 3.8–10.8)

## 2015-10-26 LAB — COMPREHENSIVE METABOLIC PANEL
ALT: 14 U/L (ref 6–29)
AST: 16 U/L (ref 10–35)
Albumin: 4.3 g/dL (ref 3.6–5.1)
Alkaline Phosphatase: 46 U/L (ref 33–115)
BILIRUBIN TOTAL: 1.3 mg/dL — AB (ref 0.2–1.2)
BUN: 15 mg/dL (ref 7–25)
CHLORIDE: 104 mmol/L (ref 98–110)
CO2: 22 mmol/L (ref 20–31)
Calcium: 9.5 mg/dL (ref 8.6–10.2)
Creat: 0.88 mg/dL (ref 0.50–1.10)
GLUCOSE: 77 mg/dL (ref 65–99)
Potassium: 4.5 mmol/L (ref 3.5–5.3)
Sodium: 138 mmol/L (ref 135–146)
Total Protein: 6.9 g/dL (ref 6.1–8.1)

## 2015-10-26 LAB — LIPID PANEL
Cholesterol: 226 mg/dL — ABNORMAL HIGH (ref 125–200)
HDL: 86 mg/dL (ref >=46)
LDL Cholesterol: 123 mg/dL (ref <130)
Total CHOL/HDL Ratio: 2.6 Ratio (ref <=5.0)
Triglycerides: 83 mg/dL (ref <150)
VLDL: 17 mg/dL (ref <30)

## 2015-10-26 LAB — TSH: TSH: 1.09 m[IU]/L

## 2015-10-26 MED ORDER — NORETHIN ACE-ETH ESTRAD-FE 1-20 MG-MCG PO TABS: ORAL_TABLET | ORAL | Status: DC

## 2015-10-26 NOTE — Progress Notes (Signed)
Leslie Tate [birth year] [id number]    History:    Presents for annual exam.  Light monthly cycle on Loestrin without complaint. Normal Pap and mammogram history. Adopted unknown family history.  Past medical history, past surgical history, family history and social history were all reviewed and documented in the EPIC chart. Works at [employer]. Has 2 labs.  ROS:  A ROS was performed and pertinent positives and negatives are included.  Exam:  Filed Vitals:   10/26/15 0817  BP: 110/80    General appearance:  Normal Thyroid:  Symmetrical, normal in size, without palpable masses or nodularity. Respiratory  Auscultation:  Clear without wheezing or rhonchi Cardiovascular  Auscultation:  Regular rate, without rubs, murmurs or gallops  Edema/varicosities:  Not grossly evident Abdominal  Soft,nontender, without masses, guarding or rebound.  Liver/spleen:  No organomegaly noted  Hernia:  None appreciated  Skin  Inspection:  Grossly normal   Breasts: Examined lying and sitting/history of reduction.     Right: Without masses, retractions, discharge or axillary adenopathy.     Left: Without masses, retractions, discharge or axillary adenopathy. Gentitourinary   Inguinal/mons:  Normal without inguinal adenopathy  External genitalia:  Normal  BUS/Urethra/Skene's glands:  Normal  Vagina:  Normal  Cervix:  Normal  Uterus:   normal in size, shape and contour.  Midline and mobile  Adnexa/parametria:     Rt: Without masses or tenderness.   Lt: Without masses or tenderness.  Anus and perineum: Normal  Digital rectal exam: Normal sphincter tone without palpated masses or tenderness  Assessment/Plan:  50 y.o. MWF G0 for annual exam with no complaints.  Monthly cycle on Loestrin  Plan: Loestrin 1/20 prescription, proper use given and reviewed slight risk for blood clots and strokes. Denies menopausal symptoms. SBE's, continue annual 3-D screening mammogram history of a breast reduction.  Continue active lifestyle of regular exercise, healthy diet. Vitamin D 1000 daily, calcium rich diet encouraged. CBC, lipid panel, CMP, vitamin D, UA, Pap normal with a negative HR HPV 2016, new screening guidelines reviewed.    Huel Cote [facility], 11:35 AM 10/26/2015

## 2015-10-26 NOTE — Patient Instructions (Signed)
Health Maintenance, Female Adopting a healthy lifestyle and getting preventive care can go a long way to promote health and wellness. Talk with your health care provider about what schedule of regular examinations is right for you. This is a good chance for you to check in with your provider about disease prevention and staying healthy. In between checkups, there are plenty of things you can do on your own. Experts have done a lot of research about which lifestyle changes and preventive measures are most likely to keep you healthy. Ask your health care provider for more information. WEIGHT AND DIET  Eat a healthy diet  Be sure to include plenty of vegetables, fruits, low-fat dairy products, and lean protein.  Do not eat a lot of foods high in solid fats, added sugars, or salt.  Get regular exercise. This is one of the most important things you can do for your health.  Most adults should exercise for at least 150 minutes each week. The exercise should increase your heart rate and make you sweat (moderate-intensity exercise).  Most adults should also do strengthening exercises at least twice a week. This is in addition to the moderate-intensity exercise.  Maintain a healthy weight  Body mass index (BMI) is a measurement that can be used to identify possible weight problems. It estimates body fat based on height and weight. Your health care provider can help determine your BMI and help you achieve or maintain a healthy weight.  For females 20 years of age and older:   A BMI below 18.5 is considered underweight.  A BMI of 18.5 to 24.9 is normal.  A BMI of 25 to 29.9 is considered overweight.  A BMI of 30 and above is considered obese.  Watch levels of cholesterol and blood lipids  You should start having your blood tested for lipids and cholesterol at 48 years of age, then have this test every 5 years.  You may need to have your cholesterol levels checked more often if:  Your lipid  or cholesterol levels are high.  You are older than 48 years of age.  You are at high risk for heart disease.  CANCER SCREENING   Lung Cancer  Lung cancer screening is recommended for adults 55-80 years old who are at high risk for lung cancer because of a history of smoking.  A yearly low-dose CT scan of the lungs is recommended for people who:  Currently smoke.  Have quit within the past 15 years.  Have at least a 30-pack-year history of smoking. A pack year is smoking an average of one pack of cigarettes a day for 1 year.  Yearly screening should continue until it has been 15 years since you quit.  Yearly screening should stop if you develop a health problem that would prevent you from having lung cancer treatment.  Breast Cancer  Practice breast self-awareness. This means understanding how your breasts normally appear and feel.  It also means doing regular breast self-exams. Let your health care provider know about any changes, no matter how small.  If you are in your 20s or 30s, you should have a clinical breast exam (CBE) by a health care provider every 1-3 years as part of a regular health exam.  If you are 40 or older, have a CBE every year. Also consider having a breast X-ray (mammogram) every year.  If you have a family history of breast cancer, talk to your health care provider about genetic screening.  If you   are at high risk for breast cancer, talk to your health care provider about having an MRI and a mammogram every year.  Breast cancer gene (BRCA) assessment is recommended for women who have family members with BRCA-related cancers. BRCA-related cancers include:  Breast.  Ovarian.  Tubal.  Peritoneal cancers.  Results of the assessment will determine the need for genetic counseling and BRCA1 and BRCA2 testing. Cervical Cancer Your health care provider may recommend that you be screened regularly for cancer of the pelvic organs (ovaries, uterus, and  vagina). This screening involves a pelvic examination, including checking for microscopic changes to the surface of your cervix (Pap test). You may be encouraged to have this screening done every 3 years, beginning at age 21.  For women ages 30-65, health care providers may recommend pelvic exams and Pap testing every 3 years, or they may recommend the Pap and pelvic exam, combined with testing for human papilloma virus (HPV), every 5 years. Some types of HPV increase your risk of cervical cancer. Testing for HPV may also be done on women of any age with unclear Pap test results.  Other health care providers may not recommend any screening for nonpregnant women who are considered low risk for pelvic cancer and who do not have symptoms. Ask your health care provider if a screening pelvic exam is right for you.  If you have had past treatment for cervical cancer or a condition that could lead to cancer, you need Pap tests and screening for cancer for at least 20 years after your treatment. If Pap tests have been discontinued, your risk factors (such as having a new sexual partner) need to be reassessed to determine if screening should resume. Some women have medical problems that increase the chance of getting cervical cancer. In these cases, your health care provider may recommend more frequent screening and Pap tests. Colorectal Cancer  This type of cancer can be detected and often prevented.  Routine colorectal cancer screening usually begins at 48 years of age and continues through 48 years of age.  Your health care provider may recommend screening at an earlier age if you have risk factors for colon cancer.  Your health care provider may also recommend using home test kits to check for hidden blood in the stool.  A small camera at the end of a tube can be used to examine your colon directly (sigmoidoscopy or colonoscopy). This is done to check for the earliest forms of colorectal  cancer.  Routine screening usually begins at age 50.  Direct examination of the colon should be repeated every 5-10 years through 48 years of age. However, you may need to be screened more often if early forms of precancerous polyps or small growths are found. Skin Cancer  Check your skin from head to toe regularly.  Tell your health care provider about any new moles or changes in moles, especially if there is a change in a mole's shape or color.  Also tell your health care provider if you have a mole that is larger than the size of a pencil eraser.  Always use sunscreen. Apply sunscreen liberally and repeatedly throughout the day.  Protect yourself by wearing long sleeves, pants, a wide-brimmed hat, and sunglasses whenever you are outside. HEART DISEASE, DIABETES, AND HIGH BLOOD PRESSURE   High blood pressure causes heart disease and increases the risk of stroke. High blood pressure is more likely to develop in:  People who have blood pressure in the high end   of the normal range (130-139/85-89 mm Hg).  People who are overweight or obese.  People who are African American.  If you are 38-23 years of age, have your blood pressure checked every 3-5 years. If you are 61 years of age or older, have your blood pressure checked every year. You should have your blood pressure measured twice--once when you are at a hospital or clinic, and once when you are not at a hospital or clinic. Record the average of the two measurements. To check your blood pressure when you are not at a hospital or clinic, you can use:  An automated blood pressure machine at a pharmacy.  A home blood pressure monitor.  If you are between 45 years and 39 years old, ask your health care provider if you should take aspirin to prevent strokes.  Have regular diabetes screenings. This involves taking a blood sample to check your fasting blood sugar level.  If you are at a normal weight and have a low risk for diabetes,  have this test once every three years after 48 years of age.  If you are overweight and have a high risk for diabetes, consider being tested at a younger age or more often. PREVENTING INFECTION  Hepatitis B  If you have a higher risk for hepatitis B, you should be screened for this virus. You are considered at high risk for hepatitis B if:  You were born in a country where hepatitis B is common. Ask your health care provider which countries are considered high risk.  Your parents were born in a high-risk country, and you have not been immunized against hepatitis B (hepatitis B vaccine).  You have HIV or AIDS.  You use needles to inject street drugs.  You live with someone who has hepatitis B.  You have had sex with someone who has hepatitis B.  You get hemodialysis treatment.  You take certain medicines for conditions, including cancer, organ transplantation, and autoimmune conditions. Hepatitis C  Blood testing is recommended for:  Everyone born from [birth year] through 1963.  Anyone with known risk factors for hepatitis C. Sexually transmitted infections (STIs)  You should be screened for sexually transmitted infections (STIs) including gonorrhea and chlamydia if:  You are sexually active and are younger than 48 years of age.  You are older than 48 years of age and your health care provider tells you that you are at risk for this type of infection.  Your sexual activity has changed since you were last screened and you are at an increased risk for chlamydia or gonorrhea. Ask your health care provider if you are at risk.  If you do not have HIV, but are at risk, it may be recommended that you take a prescription medicine daily to prevent HIV infection. This is called pre-exposure prophylaxis (PrEP). You are considered at risk if:  You are sexually active and do not regularly use condoms or know the HIV status of your partner(s).  You take drugs by injection.  You are sexually  active with a partner who has HIV. Talk with your health care provider about whether you are at high risk of being infected with HIV. If you choose to begin PrEP, you should first be tested for HIV. You should then be tested every 3 months for as long as you are taking PrEP.  PREGNANCY   If you are premenopausal and you may become pregnant, ask your health care provider about preconception counseling.  If you may  become pregnant, take 400 to 800 micrograms (mcg) of folic acid every day.  If you want to prevent pregnancy, talk to your health care provider about birth control (contraception). OSTEOPOROSIS AND MENOPAUSE   Osteoporosis is a disease in which the bones lose minerals and strength with aging. This can result in serious bone fractures. Your risk for osteoporosis can be identified using a bone density scan.  If you are 61 years of age or older, or if you are at risk for osteoporosis and fractures, ask your health care provider if you should be screened.  Ask your health care provider whether you should take a calcium or vitamin D supplement to lower your risk for osteoporosis.  Menopause may have certain physical symptoms and risks.  Hormone replacement therapy may reduce some of these symptoms and risks. Talk to your health care provider about whether hormone replacement therapy is right for you.  HOME CARE INSTRUCTIONS   Schedule regular health, dental, and eye exams.  Stay current with your immunizations.   Do not use any tobacco products including cigarettes, chewing tobacco, or electronic cigarettes.  If you are pregnant, do not drink alcohol.  If you are breastfeeding, limit how much and how often you drink alcohol.  Limit alcohol intake to no more than 1 drink per day for nonpregnant women. One drink equals 12 ounces of beer, 5 ounces of wine, or 1 ounces of hard liquor.  Do not use street drugs.  Do not share needles.  Ask your health care provider for help if  you need support or information about quitting drugs.  Tell your health care provider if you often feel depressed.  Tell your health care provider if you have ever been abused or do not feel safe at home.   This information is not intended to replace advice given to you by your health care provider. Make sure you discuss any questions you have with your health care provider.   Document Released: 10/09/2010 Document Revised: 04/16/2014 Document Reviewed: 02/25/2013 Elsevier Interactive Patient Education [employer].

## 2015-10-27 LAB — URINALYSIS W MICROSCOPIC + REFLEX CULTURE
BILIRUBIN URINE: NEGATIVE
Crystals: NONE SEEN [HPF]
Glucose, UA: NEGATIVE
Hgb urine dipstick: NEGATIVE
Ketones, ur: NEGATIVE
Leukocytes, UA: NEGATIVE
Nitrite: NEGATIVE
Specific Gravity, Urine: 1.027 (ref 1.001–1.035)
YEAST: NONE SEEN [HPF]
pH: 5.5 (ref 5.0–8.0)

## 2015-10-27 LAB — VITAMIN D 25 HYDROXY (VIT D DEFICIENCY, FRACTURES): VIT D 25 HYDROXY: 35 ng/mL (ref 30–100)

## 2015-10-28 ENCOUNTER — Other Ambulatory Visit: Payer: Self-pay | Admitting: Women's Health

## 2015-10-28 DIAGNOSIS — Z1231 Encounter for screening mammogram for malignant neoplasm of breast: Secondary | ICD-10-CM

## 2015-10-28 DIAGNOSIS — Z9889 Other specified postprocedural states: Secondary | ICD-10-CM

## 2015-10-28 LAB — URINE CULTURE: ORGANISM ID, BACTERIA: NO GROWTH

## 2015-11-10 ENCOUNTER — Ambulatory Visit
Admission: RE | Admit: 2015-11-10 | Discharge: 2015-11-10 | Disposition: A | Discharge status: Home / Self Care | Payer: 59 | Source: Ambulatory Visit | Attending: Women's Health | Admitting: Women's Health

## 2015-11-10 DIAGNOSIS — Z1231 Encounter for screening mammogram for malignant neoplasm of breast: Secondary | ICD-10-CM

## 2015-11-10 DIAGNOSIS — Z9889 Other specified postprocedural states: Secondary | ICD-10-CM

## 2016-08-22 ENCOUNTER — Encounter: Payer: Self-pay | Admitting: Gynecology

## 2016-10-12 ENCOUNTER — Other Ambulatory Visit: Payer: Self-pay | Admitting: Women's Health

## 2016-10-12 DIAGNOSIS — Z1231 Encounter for screening mammogram for malignant neoplasm of breast: Secondary | ICD-10-CM

## 2016-10-30 ENCOUNTER — Encounter: Payer: Self-pay | Admitting: Women's Health

## 2016-10-30 ENCOUNTER — Ambulatory Visit (INDEPENDENT_AMBULATORY_CARE_PROVIDER_SITE_OTHER): Payer: 59 | Admitting: Women's Health

## 2016-10-30 VITALS — BP 118/76 | Ht 63.0 in | Wt 135.0 lb

## 2016-10-30 DIAGNOSIS — Z3041 Encounter for surveillance of contraceptive pills: Secondary | ICD-10-CM

## 2016-10-30 DIAGNOSIS — Z01419 Encounter for gynecological examination (general) (routine) without abnormal findings: Secondary | ICD-10-CM

## 2016-10-30 DIAGNOSIS — Z13220 Encounter for screening for lipoid disorders: Secondary | ICD-10-CM | POA: Diagnosis not present

## 2016-10-30 LAB — LIPID PANEL
CHOL/HDL RATIO: 2.6 ratio (ref <5.0)
Cholesterol: 225 mg/dL — ABNORMAL HIGH (ref <200)
HDL: 87 mg/dL (ref >50)
LDL CALC: 105 mg/dL — AB (ref <100)
Triglycerides: 164 mg/dL — ABNORMAL HIGH (ref <150)
VLDL: 33 mg/dL — ABNORMAL HIGH (ref <30)

## 2016-10-30 LAB — CBC WITH DIFFERENTIAL/PLATELET
BASOS ABS: 81 {cells}/uL (ref 0–200)
Basophils Relative: 1 %
EOS ABS: 162 {cells}/uL (ref 15–500)
EOS PCT: 2 %
HCT: 43.3 % (ref 35.0–45.0)
Hemoglobin: 14.2 g/dL (ref 11.7–15.5)
Lymphocytes Relative: 30 %
Lymphs Abs: 2430 cells/uL (ref 850–3900)
MCH: 31.3 pg (ref 27.0–33.0)
MCHC: 32.8 g/dL (ref 32.0–36.0)
MCV: 95.4 fL (ref 80.0–100.0)
MONOS PCT: 5 %
MPV: 10 fL (ref 7.5–12.5)
Monocytes Absolute: 405 cells/uL (ref 200–950)
NEUTROS PCT: 62 %
Neutro Abs: 5022 cells/uL (ref 1500–7800)
PLATELETS: 263 10*3/uL (ref 140–400)
RBC: 4.54 MIL/uL (ref 3.80–5.10)
RDW: 13.4 % (ref 11.0–15.0)
WBC: 8.1 10*3/uL (ref 3.8–10.8)

## 2016-10-30 LAB — COMPREHENSIVE METABOLIC PANEL
ALBUMIN: 4.2 g/dL (ref 3.6–5.1)
ALT: 17 U/L (ref 6–29)
AST: 18 U/L (ref 10–35)
Alkaline Phosphatase: 48 U/L (ref 33–115)
BUN: 11 mg/dL (ref 7–25)
CHLORIDE: 102 mmol/L (ref 98–110)
CO2: 21 mmol/L (ref 20–31)
Calcium: 9.1 mg/dL (ref 8.6–10.2)
Creat: 0.77 mg/dL (ref 0.50–1.10)
Glucose, Bld: 86 mg/dL (ref 65–99)
POTASSIUM: 4.4 mmol/L (ref 3.5–5.3)
SODIUM: 136 mmol/L (ref 135–146)
TOTAL PROTEIN: 6.6 g/dL (ref 6.1–8.1)
Total Bilirubin: 1.4 mg/dL — ABNORMAL HIGH (ref 0.2–1.2)

## 2016-10-30 MED ORDER — NORETHIN ACE-ETH ESTRAD-FE 1-20 MG-MCG PO TABS: ORAL_TABLET | ORAL | 4 refills | Status: DC

## 2016-10-30 NOTE — Patient Instructions (Signed)
Colonoscopy  Dr Gessner  [phone]  Health Maintenance, Female Adopting a healthy lifestyle and getting preventive care can go a long way to promote health and wellness. Talk with your health care provider about what schedule of regular examinations is right for you. This is a good chance for you to check in with your provider about disease prevention and staying healthy. In between checkups, there are plenty of things you can do on your own. Experts have done a lot of research about which lifestyle changes and preventive measures are most likely to keep you healthy. Ask your health care provider for more information. Weight and diet Eat a healthy diet  Be sure to include plenty of vegetables, fruits, low-fat dairy products, and lean protein.  Do not eat a lot of foods high in solid fats, added sugars, or salt.  Get regular exercise. This is one of the most important things you can do for your health. ? Most adults should exercise for at least 150 minutes each week. The exercise should increase your heart rate and make you sweat (moderate-intensity exercise). ? Most adults should also do strengthening exercises at least twice a week. This is in addition to the moderate-intensity exercise.  Maintain a healthy weight  Body mass index (BMI) is a measurement that can be used to identify possible weight problems. It estimates body fat based on height and weight. Your health care provider can help determine your BMI and help you achieve or maintain a healthy weight.  For females 20 years of age and older: ? A BMI below 18.5 is considered underweight. ? A BMI of 18.5 to 24.9 is normal. ? A BMI of 25 to 29.9 is considered overweight. ? A BMI of 30 and above is considered obese.  Watch levels of cholesterol and blood lipids  You should start having your blood tested for lipids and cholesterol at 49 years of age, then have this test every 5 years.  You may need to have your cholesterol levels  checked more often if: ? Your lipid or cholesterol levels are high. ? You are older than 50 years of age. ? You are at high risk for heart disease.  Cancer screening Lung Cancer  Lung cancer screening is recommended for adults 55-80 years old who are at high risk for lung cancer because of a history of smoking.  A yearly low-dose CT scan of the lungs is recommended for people who: ? Currently smoke. ? Have quit within the past 15 years. ? Have at least a 30-pack-year history of smoking. A pack year is smoking an average of one pack of cigarettes a day for 1 year.  Yearly screening should continue until it has been 15 years since you quit.  Yearly screening should stop if you develop a health problem that would prevent you from having lung cancer treatment.  Breast Cancer  Practice breast self-awareness. This means understanding how your breasts normally appear and feel.  It also means doing regular breast self-exams. Let your health care provider know about any changes, no matter how small.  If you are in your 20s or 30s, you should have a clinical breast exam (CBE) by a health care provider every 1-3 years as part of a regular health exam.  If you are 40 or older, have a CBE every year. Also consider having a breast X-ray (mammogram) every year.  If you have a family history of breast cancer, talk to your health care provider about genetic screening.    If you are at high risk for breast cancer, talk to your health care provider about having an MRI and a mammogram every year.  Breast cancer gene (BRCA) assessment is recommended for women who have family members with BRCA-related cancers. BRCA-related cancers include: ? Breast. ? Ovarian. ? Tubal. ? Peritoneal cancers.  Results of the assessment will determine the need for genetic counseling and BRCA1 and BRCA2 testing.  Cervical Cancer Your health care provider may recommend that you be screened regularly for cancer of the  pelvic organs (ovaries, uterus, and vagina). This screening involves a pelvic examination, including checking for microscopic changes to the surface of your cervix (Pap test). You may be encouraged to have this screening done every 3 years, beginning at age 71.  For women ages 82-65, health care providers may recommend pelvic exams and Pap testing every 3 years, or they may recommend the Pap and pelvic exam, combined with testing for human papilloma virus (HPV), every 5 years. Some types of HPV increase your risk of cervical cancer. Testing for HPV may also be done on women of any age with unclear Pap test results.  Other health care providers may not recommend any screening for nonpregnant women who are considered low risk for pelvic cancer and who do not have symptoms. Ask your health care provider if a screening pelvic exam is right for you.  If you have had past treatment for cervical cancer or a condition that could lead to cancer, you need Pap tests and screening for cancer for at least 20 years after your treatment. If Pap tests have been discontinued, your risk factors (such as having a new sexual partner) need to be reassessed to determine if screening should resume. Some women have medical problems that increase the chance of getting cervical cancer. In these cases, your health care provider may recommend more frequent screening and Pap tests.  Colorectal Cancer  This type of cancer can be detected and often prevented.  Routine colorectal cancer screening usually begins at 49 years of age and continues through 49 years of age.  Your health care provider may recommend screening at an earlier age if you have risk factors for colon cancer.  Your health care provider may also recommend using home test kits to check for hidden blood in the stool.  A small camera at the end of a tube can be used to examine your colon directly (sigmoidoscopy or colonoscopy). This is done to check for the  earliest forms of colorectal cancer.  Routine screening usually begins at age 15.  Direct examination of the colon should be repeated every 5-10 years through 49 years of age. However, you may need to be screened more often if early forms of precancerous polyps or small growths are found.  Skin Cancer  Check your skin from head to toe regularly.  Tell your health care provider about any new moles or changes in moles, especially if there is a change in a mole's shape or color.  Also tell your health care provider if you have a mole that is larger than the size of a pencil eraser.  Always use sunscreen. Apply sunscreen liberally and repeatedly throughout the day.  Protect yourself by wearing long sleeves, pants, a wide-brimmed hat, and sunglasses whenever you are outside.  Heart disease, diabetes, and high blood pressure  High blood pressure causes heart disease and increases the risk of stroke. High blood pressure is more likely to develop in: ? People who have blood  pressure in the high end of the normal range (130-139/85-89 mm Hg). ? People who are overweight or obese. ? People who are African American.  If you are 56-87 years of age, have your blood pressure checked every 3-5 years. If you are 74 years of age or older, have your blood pressure checked every year. You should have your blood pressure measured twice-once when you are at a hospital or clinic, and once when you are not at a hospital or clinic. Record the average of the two measurements. To check your blood pressure when you are not at a hospital or clinic, you can use: ? An automated blood pressure machine at a pharmacy. ? A home blood pressure monitor.  If you are between 69 years and 79 years old, ask your health care provider if you should take aspirin to prevent strokes.  Have regular diabetes screenings. This involves taking a blood sample to check your fasting blood sugar level. ? If you are at a normal weight and  have a low risk for diabetes, have this test once every three years after 49 years of age. ? If you are overweight and have a high risk for diabetes, consider being tested at a younger age or more often. Preventing infection Hepatitis B  If you have a higher risk for hepatitis B, you should be screened for this virus. You are considered at high risk for hepatitis B if: ? You were born in a country where hepatitis B is common. Ask your health care provider which countries are considered high risk. ? Your parents were born in a high-risk country, and you have not been immunized against hepatitis B (hepatitis B vaccine). ? You have HIV or AIDS. ? You use needles to inject street drugs. ? You live with someone who has hepatitis B. ? You have had sex with someone who has hepatitis B. ? You get hemodialysis treatment. ? You take certain medicines for conditions, including cancer, organ transplantation, and autoimmune conditions.  Hepatitis C  Blood testing is recommended for: ? Everyone born from [birth year] through 1963. ? Anyone with known risk factors for hepatitis C.  Sexually transmitted infections (STIs)  You should be screened for sexually transmitted infections (STIs) including gonorrhea and chlamydia if: ? You are sexually active and are younger than 49 years of age. ? You are older than 49 years of age and your health care provider tells you that you are at risk for this type of infection. ? Your sexual activity has changed since you were last screened and you are at an increased risk for chlamydia or gonorrhea. Ask your health care provider if you are at risk.  If you do not have HIV, but are at risk, it may be recommended that you take a prescription medicine daily to prevent HIV infection. This is called pre-exposure prophylaxis (PrEP). You are considered at risk if: ? You are sexually active and do not regularly use condoms or know the HIV status of your partner(s). ? You take drugs by  injection. ? You are sexually active with a partner who has HIV.  Talk with your health care provider about whether you are at high risk of being infected with HIV. If you choose to begin PrEP, you should first be tested for HIV. You should then be tested every 3 months for as long as you are taking PrEP. Pregnancy  If you are premenopausal and you may become pregnant, ask your health care provider about preconception  counseling.  If you may become pregnant, take 400 to 800 micrograms (mcg) of folic acid every day.  If you want to prevent pregnancy, talk to your health care provider about birth control (contraception). Osteoporosis and menopause  Osteoporosis is a disease in which the bones lose minerals and strength with aging. This can result in serious bone fractures. Your risk for osteoporosis can be identified using a bone density scan.  If you are 54 years of age or older, or if you are at risk for osteoporosis and fractures, ask your health care provider if you should be screened.  Ask your health care provider whether you should take a calcium or vitamin D supplement to lower your risk for osteoporosis.  Menopause may have certain physical symptoms and risks.  Hormone replacement therapy may reduce some of these symptoms and risks. Talk to your health care provider about whether hormone replacement therapy is right for you. Follow these instructions at home:  Schedule regular health, dental, and eye exams.  Stay current with your immunizations.  Do not use any tobacco products including cigarettes, chewing tobacco, or electronic cigarettes.  If you are pregnant, do not drink alcohol.  If you are breastfeeding, limit how much and how often you drink alcohol.  Limit alcohol intake to no more than 1 drink per day for nonpregnant women. One drink equals 12 ounces of beer, 5 ounces of wine, or 1 ounces of hard liquor.  Do not use street drugs.  Do not share needles.  Ask  your health care provider for help if you need support or information about quitting drugs.  Tell your health care provider if you often feel depressed.  Tell your health care provider if you have ever been abused or do not feel safe at home. This information is not intended to replace advice given to you by your health care provider. Make sure you discuss any questions you have with your health care provider. Document Released: 10/09/2010 Document Revised: 09/01/2015 Document Reviewed: 12/28/2014 Elsevier Interactive Patient Education  [employer].

## 2016-10-30 NOTE — Progress Notes (Signed)
Leslie Tate [birth year] [id number]    History:    Presents for annual exam.  Amenorrheic on Loestrin 1/20 for the past 2 years. No menopausal symptoms. Normal Pap and mammogram history. Adopted/unknown family history. History of slightly elevated cholesterol.  Past medical history, past surgical history, family history and social history were all reviewed and documented in the EPIC chart. Works at [employer]. Has 2 labs. Return this week from [place] trip with girlfriends.  ROS:  A ROS was performed and pertinent positives and negatives are included.  Exam:  Vitals:   10/30/16 0950  BP: 118/76  Weight: 135 lb (61.2 kg)  Height: 5\' 3"  (1.6 m)   Body mass index is 23.91 kg/m.   General appearance:  Normal Thyroid:  Symmetrical, normal in size, without palpable masses or nodularity. Respiratory  Auscultation:  Clear without wheezing or rhonchi Cardiovascular  Auscultation:  Regular rate, without rubs, murmurs or gallops  Edema/varicosities:  Not grossly evident Abdominal  Soft,nontender, without masses, guarding or rebound.  Liver/spleen:  No organomegaly noted  Hernia:  None appreciated  Skin  Inspection:  Grossly normal   Breasts: Examined lying and sitting.     Right: Without masses, retractions, discharge or axillary adenopathy.     Left: Without masses, retractions, discharge or axillary adenopathy. Gentitourinary   Inguinal/mons:  Normal without inguinal adenopathy  External genitalia:  Normal  BUS/Urethra/Skene's glands:  Normal  Vagina:  Normal  Cervix:  Normal  Uterus:  normal in size, shape and contour.  Midline and mobile  Adnexa/parametria:     Rt: Without masses or tenderness.   Lt: Without masses or tenderness.  Anus and perineum: Normal  Digital rectal exam: Normal sphincter tone without palpated masses or tenderness  Assessment/Plan:  51 y.o. MWF G0  for annual exam with no complaints.  Amenorrheic on Loestrin  Plan: Menopause reviewed,  asymptomatic at this time will check FSH placebo week next year. SBE's, continue annual screening mammogram has scheduled next month. Continue healthy lifestyle routine of healthy diet and regular exercise. CBC, lipid panel, CMP, UA. Pap normal with negative HR HPV 2016, new screening guidelines reviewed.  [place], 1:12 PM 10/30/2016

## 2016-10-31 LAB — URINALYSIS W MICROSCOPIC + REFLEX CULTURE
Bacteria, UA: NONE SEEN [HPF]
Bilirubin Urine: NEGATIVE
Casts: NONE SEEN [LPF]
Crystals: NONE SEEN [HPF]
Glucose, UA: NEGATIVE
Hgb urine dipstick: NEGATIVE
Ketones, ur: NEGATIVE
Leukocytes, UA: NEGATIVE
Nitrite: NEGATIVE
Protein, ur: NEGATIVE
RBC / HPF: NONE SEEN RBC/HPF (ref <=2)
Specific Gravity, Urine: 1.006 (ref 1.001–1.035)
Squamous Epithelial / LPF: NONE SEEN [HPF] (ref <=5)
WBC, UA: NONE SEEN WBC/HPF (ref <=5)
Yeast: NONE SEEN [HPF]
pH: 6.5 (ref 5.0–8.0)

## 2016-11-13 ENCOUNTER — Ambulatory Visit: Payer: 59

## 2016-11-13 ENCOUNTER — Ambulatory Visit
Admission: RE | Admit: 2016-11-13 | Discharge: 2016-11-13 | Disposition: A | Discharge status: Home / Self Care | Payer: 59 | Source: Ambulatory Visit | Attending: Women's Health | Admitting: Women's Health

## 2016-11-13 ENCOUNTER — Other Ambulatory Visit: Payer: Self-pay | Admitting: Women's Health

## 2016-11-13 DIAGNOSIS — Z3041 Encounter for surveillance of contraceptive pills: Secondary | ICD-10-CM

## 2016-11-13 DIAGNOSIS — Z1231 Encounter for screening mammogram for malignant neoplasm of breast: Secondary | ICD-10-CM

## 2016-12-06 ENCOUNTER — Telehealth: Payer: Self-pay | Admitting: *Deleted

## 2016-12-06 NOTE — Telephone Encounter (Signed)
Pt called requesting Xanax Rx, I left message for pt to call.

## 2016-12-06 NOTE — Telephone Encounter (Signed)
Pt called requesting low dose Xanax for anxiety states she had a Rx for this years ago. Please advsie

## 2016-12-10 NOTE — Telephone Encounter (Signed)
Ok xanax .25 mg 1 daily prn, #30    review to use sparingly, not daily addictive.

## 2016-12-11 MED ORDER — ALPRAZOLAM 0.25 MG PO TABS: 0.2500 mg | ORAL_TABLET | Freq: Every evening | ORAL | 0 refills | Status: DC | PRN

## 2016-12-11 NOTE — Telephone Encounter (Signed)
Pt informed, Rx called in.  

## 2017-12-20 ENCOUNTER — Other Ambulatory Visit: Payer: Self-pay | Admitting: Women's Health

## 2017-12-20 DIAGNOSIS — Z1231 Encounter for screening mammogram for malignant neoplasm of breast: Secondary | ICD-10-CM

## 2017-12-25 ENCOUNTER — Other Ambulatory Visit: Payer: Self-pay | Admitting: *Deleted

## 2017-12-25 ENCOUNTER — Ambulatory Visit (INDEPENDENT_AMBULATORY_CARE_PROVIDER_SITE_OTHER): Payer: 59 | Admitting: Women's Health

## 2017-12-25 ENCOUNTER — Encounter: Payer: Self-pay | Admitting: Women's Health

## 2017-12-25 VITALS — BP 120/82 | Ht 63.0 in | Wt 140.0 lb

## 2017-12-25 DIAGNOSIS — Z3041 Encounter for surveillance of contraceptive pills: Secondary | ICD-10-CM | POA: Diagnosis not present

## 2017-12-25 DIAGNOSIS — Z13220 Encounter for screening for lipoid disorders: Secondary | ICD-10-CM | POA: Diagnosis not present

## 2017-12-25 DIAGNOSIS — Z01419 Encounter for gynecological examination (general) (routine) without abnormal findings: Secondary | ICD-10-CM | POA: Diagnosis not present

## 2017-12-25 LAB — COMPREHENSIVE METABOLIC PANEL
AG RATIO: 1.9 (calc) (ref 1.0–2.5)
ALKALINE PHOSPHATASE (APISO): 46 U/L (ref 33–130)
ALT: 21 U/L (ref 6–29)
AST: 18 U/L (ref 10–35)
Albumin: 4.5 g/dL (ref 3.6–5.1)
BUN: 14 mg/dL (ref 7–25)
CALCIUM: 9.9 mg/dL (ref 8.6–10.4)
CHLORIDE: 103 mmol/L (ref 98–110)
CO2: 21 mmol/L (ref 20–32)
Creat: 0.75 mg/dL (ref 0.50–1.05)
GLOBULIN: 2.4 g/dL (ref 1.9–3.7)
Glucose, Bld: 90 mg/dL (ref 65–99)
Potassium: 4.5 mmol/L (ref 3.5–5.3)
Sodium: 137 mmol/L (ref 135–146)
Total Bilirubin: 1.6 mg/dL — ABNORMAL HIGH (ref 0.2–1.2)
Total Protein: 6.9 g/dL (ref 6.1–8.1)

## 2017-12-25 LAB — LIPID PANEL
CHOLESTEROL: 216 mg/dL — AB (ref <200)
HDL: 99 mg/dL (ref >50)
LDL Cholesterol (Calc): 98 mg/dL (calc)
Non-HDL Cholesterol (Calc): 117 mg/dL (calc) (ref <130)
Total CHOL/HDL Ratio: 2.2 (calc) (ref <5.0)
Triglycerides: 94 mg/dL (ref <150)

## 2017-12-25 LAB — CBC WITH DIFFERENTIAL/PLATELET
BASOS ABS: 72 {cells}/uL (ref 0–200)
BASOS PCT: 0.7 %
EOS ABS: 206 {cells}/uL (ref 15–500)
Eosinophils Relative: 2 %
HCT: 44.7 % (ref 35.0–45.0)
HEMOGLOBIN: 15.1 g/dL (ref 11.7–15.5)
Lymphs Abs: 2760 cells/uL (ref 850–3900)
MCH: 31.1 pg (ref 27.0–33.0)
MCHC: 33.8 g/dL (ref 32.0–36.0)
MCV: 92.2 fL (ref 80.0–100.0)
MONOS PCT: 5.3 %
MPV: 10.5 fL (ref 7.5–12.5)
NEUTROS ABS: 6716 {cells}/uL (ref 1500–7800)
Neutrophils Relative %: 65.2 %
Platelets: 267 10*3/uL (ref 140–400)
RBC: 4.85 10*6/uL (ref 3.80–5.10)
RDW: 11.8 % (ref 11.0–15.0)
TOTAL LYMPHOCYTE: 26.8 %
WBC: 10.3 10*3/uL (ref 3.8–10.8)
WBCMIX: 546 {cells}/uL (ref 200–950)

## 2017-12-25 MED ORDER — NORETHIN ACE-ETH ESTRAD-FE 1-20 MG-MCG PO TABS: 1.0000 | ORAL_TABLET | Freq: Every day | ORAL | 4 refills | Status: DC

## 2017-12-25 MED ORDER — ALPRAZOLAM 0.25 MG PO TABS: 0.2500 mg | ORAL_TABLET | Freq: Every evening | ORAL | 1 refills | Status: DC | PRN

## 2017-12-25 NOTE — Patient Instructions (Signed)
Colonoscopy  Leslie Tate  GI  [phone]  Dr Carlean Purl   Health Maintenance, Female Adopting a healthy lifestyle and getting preventive care can go a long way to promote health and wellness. Talk with your health care provider about what schedule of regular examinations is right for you. This is a good chance for you to check in with your provider about disease prevention and staying healthy. In between checkups, there are plenty of things you can do on your own. Experts have done a lot of research about which lifestyle changes and preventive measures are most likely to keep you healthy. Ask your health care provider for more information. Weight and diet Eat a healthy diet  Be sure to include plenty of vegetables, fruits, low-fat dairy products, and lean protein.  Do not eat a lot of foods high in solid fats, added sugars, or salt.  Get regular exercise. This is one of the most important things you can do for your health. ? Most adults should exercise for at least 150 minutes each week. The exercise should increase your heart rate and make you sweat (moderate-intensity exercise). ? Most adults should also do strengthening exercises at least twice a week. This is in addition to the moderate-intensity exercise.  Maintain a healthy weight  Body mass index (BMI) is a measurement that can be used to identify possible weight problems. It estimates body fat based on height and weight. Your health care provider can help determine your BMI and help you achieve or maintain a healthy weight.  For females 43 years of age and older: ? A BMI below 18.5 is considered underweight. ? A BMI of 18.5 to 24.9 is normal. ? A BMI of 25 to 29.9 is considered overweight. ? A BMI of 30 and above is considered obese.  Watch levels of cholesterol and blood lipids  You should start having your blood tested for lipids and cholesterol at 50 years of age, then have this test every 5 years.  You may need to have your  cholesterol levels checked more often if: ? Your lipid or cholesterol levels are high. ? You are older than 50 years of age. ? You are at high risk for heart disease.  Cancer screening Lung Cancer  Lung cancer screening is recommended for adults 56-64 years old who are at high risk for lung cancer because of a history of smoking.  A yearly low-dose CT scan of the lungs is recommended for people who: ? Currently smoke. ? Have quit within the past 15 years. ? Have at least a 30-pack-year history of smoking. A pack year is smoking an average of one pack of cigarettes a day for 1 year.  Yearly screening should continue until it has been 15 years since you quit.  Yearly screening should stop if you develop a health problem that would prevent you from having lung cancer treatment.  Breast Cancer  Practice breast self-awareness. This means understanding how your breasts normally appear and feel.  It also means doing regular breast self-exams. Let your health care provider know about any changes, no matter how small.  If you are in your 20s or 30s, you should have a clinical breast exam (CBE) by a health care provider every 1-3 years as part of a regular health exam.  If you are 34 or older, have a CBE every year. Also consider having a breast X-ray (mammogram) every year.  If you have a family history of breast cancer, talk to your health  provider about genetic screening.  If you are at high risk for breast cancer, talk to your health care provider about having an MRI and a mammogram every year.  Breast cancer gene (BRCA) assessment is recommended for women who have family members with BRCA-related cancers. BRCA-related cancers include: ? Breast. ? Ovarian. ? Tubal. ? Peritoneal cancers.  Results of the assessment will determine the need for genetic counseling and BRCA1 and BRCA2 testing.  Cervical Cancer Your health care provider may recommend that you be screened regularly for cancer of  the pelvic organs (ovaries, uterus, and vagina). This screening involves a pelvic examination, including checking for microscopic changes to the surface of your cervix (Pap test). You may be encouraged to have this screening done every 3 years, beginning at age 21.  For women ages 30-65, health care providers may recommend pelvic exams and Pap testing every 3 years, or they may recommend the Pap and pelvic exam, combined with testing for human papilloma virus (HPV), every 5 years. Some types of HPV increase your risk of cervical cancer. Testing for HPV may also be done on women of any age with unclear Pap test results.  Other health care providers may not recommend any screening for nonpregnant women who are considered low risk for pelvic cancer and who do not have symptoms. Ask your health care provider if a screening pelvic exam is right for you.  If you have had past treatment for cervical cancer or a condition that could lead to cancer, you need Pap tests and screening for cancer for at least 20 years after your treatment. If Pap tests have been discontinued, your risk factors (such as having a new sexual partner) need to be reassessed to determine if screening should resume. Some women have medical problems that increase the chance of getting cervical cancer. In these cases, your health care provider may recommend more frequent screening and Pap tests.  Colorectal Cancer  This type of cancer can be detected and often prevented.  Routine colorectal cancer screening usually begins at 50 years of age and continues through 50 years of age.  Your health care provider may recommend screening at an earlier age if you have risk factors for colon cancer.  Your health care provider may also recommend using home test kits to check for hidden blood in the stool.  A small camera at the end of a tube can be used to examine your colon directly (sigmoidoscopy or colonoscopy). This is done to check for the  earliest forms of colorectal cancer.  Routine screening usually begins at age 50.  Direct examination of the colon should be repeated every 5-10 years through 50 years of age. However, you may need to be screened more often if early forms of precancerous polyps or small growths are found.  Skin Cancer  Check your skin from head to toe regularly.  Tell your health care provider about any new moles or changes in moles, especially if there is a change in a mole's shape or color.  Also tell your health care provider if you have a mole that is larger than the size of a pencil eraser.  Always use sunscreen. Apply sunscreen liberally and repeatedly throughout the day.  Protect yourself by wearing long sleeves, pants, a wide-brimmed hat, and sunglasses whenever you are outside.  Heart disease, diabetes, and high blood pressure  High blood pressure causes heart disease and increases the risk of stroke. High blood pressure is more likely to develop in: ?   People who have blood pressure in the high end of the normal range (130-139/85-89 mm Hg). ? People who are overweight or obese. ? People who are African American.  If you are 18-39 years of age, have your blood pressure checked every 3-5 years. If you are 40 years of age or older, have your blood pressure checked every year. You should have your blood pressure measured twice-once when you are at a hospital or clinic, and once when you are not at a hospital or clinic. Record the average of the two measurements. To check your blood pressure when you are not at a hospital or clinic, you can use: ? An automated blood pressure machine at a pharmacy. ? A home blood pressure monitor.  If you are between 55 years and 79 years old, ask your health care provider if you should take aspirin to prevent strokes.  Have regular diabetes screenings. This involves taking a blood sample to check your fasting blood sugar level. ? If you are at a normal weight and  have a low risk for diabetes, have this test once every three years after 50 years of age. ? If you are overweight and have a high risk for diabetes, consider being tested at a younger age or more often. Preventing infection Hepatitis B  If you have a higher risk for hepatitis B, you should be screened for this virus. You are considered at high risk for hepatitis B if: ? You were born in a country where hepatitis B is common. Ask your health care provider which countries are considered high risk. ? Your parents were born in a high-risk country, and you have not been immunized against hepatitis B (hepatitis B vaccine). ? You have HIV or AIDS. ? You use needles to inject street drugs. ? You live with someone who has hepatitis B. ? You have had sex with someone who has hepatitis B. ? You get hemodialysis treatment. ? You take certain medicines for conditions, including cancer, organ transplantation, and autoimmune conditions.  Hepatitis C  Blood testing is recommended for: ? Everyone born from 1945 through 1965. ? Anyone with known risk factors for hepatitis C.  Sexually transmitted infections (STIs)  You should be screened for sexually transmitted infections (STIs) including gonorrhea and chlamydia if: ? You are sexually active and are younger than 50 years of age. ? You are older than 50 years of age and your health care provider tells you that you are at risk for this type of infection. ? Your sexual activity has changed since you were last screened and you are at an increased risk for chlamydia or gonorrhea. Ask your health care provider if you are at risk.  If you do not have HIV, but are at risk, it may be recommended that you take a prescription medicine daily to prevent HIV infection. This is called pre-exposure prophylaxis (PrEP). You are considered at risk if: ? You are sexually active and do not regularly use condoms or know the HIV status of your partner(s). ? You take drugs by  injection. ? You are sexually active with a partner who has HIV.  Talk with your health care provider about whether you are at high risk of being infected with HIV. If you choose to begin PrEP, you should first be tested for HIV. You should then be tested every 3 months for as long as you are taking PrEP. Pregnancy  If you are premenopausal and you may become pregnant, ask your health   your health care provider about preconception counseling.  If you may become pregnant, take 400 to 800 micrograms (mcg) of folic acid every day.  If you want to prevent pregnancy, talk to your health care provider about birth control (contraception). Osteoporosis and menopause  Osteoporosis is a disease in which the bones lose minerals and strength with aging. This can result in serious bone fractures. Your risk for osteoporosis can be identified using a bone density scan.  If you are 13 years of age or older, or if you are at risk for osteoporosis and fractures, ask your health care provider if you should be screened.  Ask your health care provider whether you should take a calcium or vitamin D supplement to lower your risk for osteoporosis.  Menopause may have certain physical symptoms and risks.  Hormone replacement therapy may reduce some of these symptoms and risks. Talk to your health care provider about whether hormone replacement therapy is right for you. Follow these instructions at home:  Schedule regular health, dental, and eye exams.  Stay current with your immunizations.  Do not use any tobacco products including cigarettes, chewing tobacco, or electronic cigarettes.  If you are pregnant, do not drink alcohol.  If you are breastfeeding, limit how much and how often you drink alcohol.  Limit alcohol intake to no more than 1 drink per day for nonpregnant women. One drink equals 12 ounces of beer, 5 ounces of wine, or 1 ounces of hard liquor.  Do not use street  drugs.  Do not share needles.  Ask your health care provider for help if you need support or information about quitting drugs.  Tell your health care provider if you often feel depressed.  Tell your health care provider if you have ever been abused or do not feel safe at home. This information is not intended to replace advice given to you by your health care provider. Make sure you discuss any questions you have with your health care provider. Document Released: 10/09/2010 Document Revised: 09/01/2015 Document Reviewed: 12/28/2014 Elsevier Interactive Patient Education  [employer].

## 2017-12-25 NOTE — Progress Notes (Signed)
Leslie Tate 1965-08-06 [id number]    History:    Presents for annual exam.  Amenorrheic on Loestrin continuously no menopausal symptoms.  Normal Pap and mammogram history.  Has not had a screening colonoscopy.  Adopted.  Past medical history, past surgical history, family history and social history were all reviewed and documented in the EPIC chart.  Works for [employer].  Has 2 labs.  ROS:  A ROS was performed and pertinent positives and negatives are included.  Exam:  Vitals:   12/25/17 0920  BP: 120/82  Weight: 140 lb (63.5 kg)  Height: 5\' 3"  (1.6 m)   Body mass index is 24.8 kg/m.   General appearance:  Normal Thyroid:  Symmetrical, normal in size, without palpable masses or nodularity. Respiratory  Auscultation:  Clear without wheezing or rhonchi Cardiovascular  Auscultation:  Regular rate, without rubs, murmurs or gallops  Edema/varicosities:  Not grossly evident Abdominal  Soft,nontender, without masses, guarding or rebound.  Liver/spleen:  No organomegaly noted  Hernia:  None appreciated  Skin  Inspection:  Grossly normal   Breasts: Examined lying and sitting.     Right: Without masses, retractions, discharge or axillary adenopathy.     Left: Without masses, retractions, discharge or axillary adenopathy. Gentitourinary   Inguinal/mons:  Normal without inguinal adenopathy  External genitalia:  Normal  BUS/Urethra/Skene's glands:  Normal  Vagina:  Normal  Cervix:  Normal  Uterus:   normal in size, shape and contour.  Midline and mobile  Adnexa/parametria:     Rt: Without masses or tenderness.   Lt: Without masses or tenderness.  Anus and perineum: Normal  Digital rectal exam: Normal sphincter tone without palpated masses or tenderness  Assessment/Plan:  52 y.o. WF G0 for annual exam no complaints.  Amenorrheic on Loestrin continuously  Plan: Screening colonoscopy reviewed and encouraged [facility] information given instructed to schedule SBE's,  continue annual screening mammogram, calcium rich foods, vitamin D 2000 daily and continue regular exercise encouraged.  Xanax 0.25 as needed, rare use, reviewed addictive properties and to use sparingly.  CBC, CMP, lipid panel, Pap with HR HPV typing, new screening guidelines reviewed.    [place], 11:30 AM 12/25/2017

## 2017-12-26 ENCOUNTER — Telehealth: Payer: Self-pay | Admitting: Women's Health

## 2017-12-26 LAB — PAP IG W/ RFLX HPV ASCU

## 2017-12-26 NOTE — Telephone Encounter (Signed)
TC to review labs chol better, continue exercise and healthy lifestyle.

## 2018-01-21 ENCOUNTER — Encounter: Payer: Self-pay | Admitting: Gastroenterology

## 2018-01-23 ENCOUNTER — Ambulatory Visit
Admission: RE | Admit: 2018-01-23 | Discharge: 2018-01-23 | Disposition: A | Discharge status: Home / Self Care | Payer: 59 | Source: Ambulatory Visit | Attending: Women's Health | Admitting: Women's Health

## 2018-01-23 DIAGNOSIS — Z1231 Encounter for screening mammogram for malignant neoplasm of breast: Secondary | ICD-10-CM

## 2018-01-30 ENCOUNTER — Ambulatory Visit (AMBULATORY_SURGERY_CENTER): Payer: Self-pay | Admitting: *Deleted

## 2018-01-30 VITALS — Ht 63.0 in | Wt 145.0 lb

## 2018-01-30 DIAGNOSIS — Z1211 Encounter for screening for malignant neoplasm of colon: Secondary | ICD-10-CM

## 2018-01-30 MED ORDER — NA SULFATE-K SULFATE-MG SULF 17.5-3.13-1.6 GM/177ML PO SOLN: ORAL | 0 refills | Status: DC

## 2018-01-30 NOTE — Progress Notes (Signed)
Patient denies any allergies to eggs or soy. Patient denies any problems with anesthesia/sedation. Patient denies any oxygen use at home. Patient denies taking any diet/weight loss medications or blood thinners. EMMI education assisgned to patient on colonoscopy, this was explained and instructions given to patient. Suprep Coupon $15 given to pt.

## 2018-02-27 ENCOUNTER — Encounter: Payer: 59 | Admitting: Gastroenterology

## 2018-03-04 ENCOUNTER — Encounter: Payer: Self-pay | Admitting: Gastroenterology

## 2018-03-18 ENCOUNTER — Encounter: Payer: Self-pay | Admitting: Gastroenterology

## 2018-03-18 ENCOUNTER — Ambulatory Visit (AMBULATORY_SURGERY_CENTER): Payer: 59 | Admitting: Gastroenterology

## 2018-03-18 VITALS — BP 107/54 | HR 59 | Temp 98.0°F | Resp 13 | Ht 63.0 in | Wt 145.0 lb

## 2018-03-18 DIAGNOSIS — D122 Benign neoplasm of ascending colon: Secondary | ICD-10-CM

## 2018-03-18 DIAGNOSIS — K621 Rectal polyp: Secondary | ICD-10-CM

## 2018-03-18 DIAGNOSIS — K635 Polyp of colon: Secondary | ICD-10-CM | POA: Diagnosis not present

## 2018-03-18 DIAGNOSIS — Z1211 Encounter for screening for malignant neoplasm of colon: Secondary | ICD-10-CM

## 2018-03-18 DIAGNOSIS — D120 Benign neoplasm of cecum: Secondary | ICD-10-CM

## 2018-03-18 DIAGNOSIS — D128 Benign neoplasm of rectum: Secondary | ICD-10-CM

## 2018-03-18 DIAGNOSIS — D129 Benign neoplasm of anus and anal canal: Secondary | ICD-10-CM

## 2018-03-18 MED ORDER — SODIUM CHLORIDE 0.9 % IV SOLN: 500.0000 mL | Freq: Once | INTRAVENOUS | Status: DC

## 2018-03-18 NOTE — Patient Instructions (Signed)
YOU HAD AN ENDOSCOPIC PROCEDURE TODAY AT THE [facility]:   Refer to the procedure report that was given to you for any specific questions about what was found during the examination.  If the procedure report does not answer your questions, please call your gastroenterologist to clarify.  If you requested that your care partner not be given the details of your procedure findings, then the procedure report has been included in a sealed envelope for you to review at your convenience later.  YOU SHOULD EXPECT: Some feelings of bloating in the abdomen. Passage of more gas than usual.  Walking can help get rid of the air that was put into your GI tract during the procedure and reduce the bloating. If you had a lower endoscopy (such as a colonoscopy or flexible sigmoidoscopy) you may notice spotting of blood in your stool or on the toilet paper. If you underwent a bowel prep for your procedure, you may not have a normal bowel movement for a few days.  Please Note:  You might notice some irritation and congestion in your nose or some drainage.  This is from the oxygen used during your procedure.  There is no need for concern and it should clear up in a day or so.  SYMPTOMS TO REPORT IMMEDIATELY:   Following lower endoscopy (colonoscopy or flexible sigmoidoscopy):  Excessive amounts of blood in the stool  Significant tenderness or worsening of abdominal pains  Swelling of the abdomen that is new, acute  Fever of 100F or higher   For urgent or emergent issues, a gastroenterologist can be reached at any hour by calling [phone].   DIET:  We do recommend a small meal at first, but then you may proceed to your regular diet.  Drink plenty of fluids but you should avoid alcoholic beverages for 24 hours.  ACTIVITY:  You should plan to take it easy for the rest of today and you should NOT DRIVE or use heavy machinery until tomorrow (because of the sedation medicines used during the test).     FOLLOW UP: Our staff will call the number listed on your records the next business day following your procedure to check on you and address any questions or concerns that you may have regarding the information given to you following your procedure. If we do not reach you, we will leave a message.  However, if you are feeling well and you are not experiencing any problems, there is no need to return our call.  We will assume that you have returned to your regular daily activities without incident.  If any biopsies were taken you will be contacted by phone or by letter within the next 1-3 weeks.  Please call us at [phone] if you have not heard about the biopsies in 3 weeks.    SIGNATURES/CONFIDENTIALITY: You and/or your care partner have signed paperwork which will be entered into your electronic medical record.  These signatures attest to the fact that that the information above on your After Visit Summary has been reviewed and is understood.  Full responsibility of the confidentiality of this discharge information lies with you and/or your care-partner.  Read all handouts given to you by your recovery room nurse. 

## 2018-03-18 NOTE — Progress Notes (Signed)
To PACU, VSS. Report to [email or web address] 

## 2018-03-18 NOTE — Op Note (Signed)
[facility] Patient Name: Leslie Tate Procedure Date: 03/18/2018 8:47 AM MRN: [id number] Endoscopist: Gerrit Heck , MD Age: 52 Referring MD:  Date of Birth: 1965/12/03 Gender: Female Account #: [id number] Procedure:                Colonoscopy Indications:              Screening for colorectal malignant neoplasm, This                            is the patient's first colonoscopy Medicines:                Monitored Anesthesia Care Procedure:                Pre-Anesthesia Assessment:                           - Prior to the procedure, a History and Physical                            was performed, and patient medications and                            allergies were reviewed. The patient's tolerance of                            previous anesthesia was also reviewed. The risks                            and benefits of the procedure and the sedation                            options and risks were discussed with the patient.                            All questions were answered, and informed consent                            was obtained. Prior Anticoagulants: The patient has                            taken no previous anticoagulant or antiplatelet                            agents. ASA Grade Assessment: II - A patient with                            mild systemic disease. After reviewing the risks                            and benefits, the patient was deemed in                            satisfactory condition to undergo the procedure.  After obtaining informed consent, the colonoscope                            was passed under direct vision. Throughout the                            procedure, the patient's blood pressure, pulse, and                            oxygen saturations were monitored continuously. The                            Colonoscope was introduced through the anus and                            advanced to the the  cecum, identified by                            appendiceal orifice and ileocecal valve. The                            colonoscopy was performed without difficulty. The                            patient tolerated the procedure well. The quality                            of the bowel preparation was adequate. Scope In: 8:53:34 AM Scope Out: 9:14:29 AM Scope Withdrawal Time: 0 hours 17 minutes 26 seconds  Total Procedure Duration: 0 hours 20 minutes 55 seconds  Findings:                 The perianal and digital rectal examinations were                            normal.                           A 5 mm polyp was found in the cecum. The polyp was                            flat with an adherent mucus cap. The polyp was                            removed with a cold snare. Resection and retrieval                            were complete. Estimated blood loss was minimal.                           A 3 mm polyp was found in the ascending colon. The                            polyp was sessile. The  polyp was removed with a                            cold snare. Resection and retrieval were complete.                            Estimated blood loss was minimal.                           A 2 mm polyp was found in the rectum. The polyp was                            sessile. The polyp was removed with a cold biopsy                            forceps. Resection and retrieval were complete.                            Estimated blood loss was minimal.                           The retroflexed view of the distal rectum and anal                            verge was normal and showed no anal or rectal                            abnormalities. Complications:            No immediate complications. Estimated Blood Loss:     Estimated blood loss was minimal. Impression:               - One 5 mm polyp in the cecum, removed with a cold                            snare. Resected and retrieved.                            - One 3 mm polyp in the ascending colon, removed                            with a cold snare. Resected and retrieved.                           - One 2 mm polyp in the rectum, removed with a cold                            biopsy forceps. Resected and retrieved.                           - The distal rectum and anal verge are normal on                            retroflexion view.  Recommendation:           - Patient has a contact number available for                            emergencies. The signs and symptoms of potential                            delayed complications were discussed with the                            patient. Return to normal activities tomorrow.                            Written discharge instructions were provided to the                            patient.                           - Resume previous diet.                           - Continue present medications.                           - Await pathology results.                           - Repeat colonoscopy in 3 - 5 years for                            surveillance based on pathology results.                           - Return to GI office PRN. Gerrit Heck, MD 03/18/2018 9:21:23 AM

## 2018-03-18 NOTE — Progress Notes (Signed)
Called to room to assist during endoscopic procedure.  Patient ID and intended procedure confirmed with present staff. Received instructions for my participation in the procedure from the performing physician.  

## 2018-03-19 ENCOUNTER — Telehealth: Payer: Self-pay

## 2018-03-19 NOTE — Telephone Encounter (Signed)
  Follow up Call-  Call back number 03/18/2018  Post procedure Call Back phone  # [phone]  Permission to leave phone message Yes  Some recent data might be hidden     Patient questions:  Do you have a fever, pain , or abdominal swelling? No. Pain Score  0 *  Have you tolerated food without any problems? Yes.    Have you been able to return to your normal activities? Yes.    Do you have any questions about your discharge instructions: Diet   No. Medications  No. Follow up visit  No.  Do you have questions or concerns about your Care? No.  Actions: * If pain score is 4 or above: No action needed, pain <4.  No problems noted in the recovery room.maw

## 2018-03-19 NOTE — Telephone Encounter (Signed)
Called [phone] and left a messaged we tried to reach pt for a follow up call. maw

## 2018-03-21 ENCOUNTER — Encounter: Payer: Self-pay | Admitting: Gastroenterology

## 2018-12-24 ENCOUNTER — Other Ambulatory Visit: Payer: Self-pay | Admitting: Women's Health

## 2018-12-24 DIAGNOSIS — Z1231 Encounter for screening mammogram for malignant neoplasm of breast: Secondary | ICD-10-CM

## 2018-12-29 ENCOUNTER — Other Ambulatory Visit: Payer: Self-pay

## 2018-12-30 ENCOUNTER — Encounter: Payer: Self-pay | Admitting: Women's Health

## 2018-12-30 ENCOUNTER — Ambulatory Visit (INDEPENDENT_AMBULATORY_CARE_PROVIDER_SITE_OTHER): Payer: 59 | Admitting: Women's Health

## 2018-12-30 VITALS — BP 118/76 | Ht 63.0 in | Wt 145.0 lb

## 2018-12-30 DIAGNOSIS — Z01419 Encounter for gynecological examination (general) (routine) without abnormal findings: Secondary | ICD-10-CM

## 2018-12-30 DIAGNOSIS — Z23 Encounter for immunization: Secondary | ICD-10-CM

## 2018-12-30 DIAGNOSIS — N912 Amenorrhea, unspecified: Secondary | ICD-10-CM

## 2018-12-30 DIAGNOSIS — Z13220 Encounter for screening for lipoid disorders: Secondary | ICD-10-CM

## 2018-12-30 LAB — COMPREHENSIVE METABOLIC PANEL
AG Ratio: 1.7 (calc) (ref 1.0–2.5)
ALT: 17 U/L (ref 6–29)
AST: 17 U/L (ref 10–35)
Albumin: 4.1 g/dL (ref 3.6–5.1)
Alkaline phosphatase (APISO): 38 U/L (ref 37–153)
BUN: 9 mg/dL (ref 7–25)
CO2: 24 mmol/L (ref 20–32)
Calcium: 9.4 mg/dL (ref 8.6–10.4)
Chloride: 105 mmol/L (ref 98–110)
Creat: 0.7 mg/dL (ref 0.50–1.05)
Globulin: 2.4 g/dL (calc) (ref 1.9–3.7)
Glucose, Bld: 89 mg/dL (ref 65–99)
Potassium: 4.5 mmol/L (ref 3.5–5.3)
Sodium: 137 mmol/L (ref 135–146)
Total Bilirubin: 1.1 mg/dL (ref 0.2–1.2)
Total Protein: 6.5 g/dL (ref 6.1–8.1)

## 2018-12-30 LAB — CBC WITH DIFFERENTIAL/PLATELET
Absolute Monocytes: 418 cells/uL (ref 200–950)
Basophils Absolute: 57 cells/uL (ref 0–200)
Basophils Relative: 0.7 %
Eosinophils Absolute: 189 cells/uL (ref 15–500)
Eosinophils Relative: 2.3 %
HCT: 39.9 % (ref 35.0–45.0)
Hemoglobin: 13.4 g/dL (ref 11.7–15.5)
Lymphs Abs: 2550 cells/uL (ref 850–3900)
MCH: 31.5 pg (ref 27.0–33.0)
MCHC: 33.6 g/dL (ref 32.0–36.0)
MCV: 93.7 fL (ref 80.0–100.0)
MPV: 10.6 fL (ref 7.5–12.5)
Monocytes Relative: 5.1 %
Neutro Abs: 4986 cells/uL (ref 1500–7800)
Neutrophils Relative %: 60.8 %
Platelets: 279 10*3/uL (ref 140–400)
RBC: 4.26 10*6/uL (ref 3.80–5.10)
RDW: 11.8 % (ref 11.0–15.0)
Total Lymphocyte: 31.1 %
WBC: 8.2 10*3/uL (ref 3.8–10.8)

## 2018-12-30 LAB — LIPID PANEL
Cholesterol: 207 mg/dL — ABNORMAL HIGH (ref <200)
HDL: 72 mg/dL (ref >=50)
LDL Cholesterol (Calc): 113 mg/dL (calc) — ABNORMAL HIGH
Non-HDL Cholesterol (Calc): 135 mg/dL (calc) — ABNORMAL HIGH (ref <130)
Total CHOL/HDL Ratio: 2.9 (calc) (ref <5.0)
Triglycerides: 111 mg/dL (ref <150)

## 2018-12-30 LAB — FOLLICLE STIMULATING HORMONE: FSH: 2.8 m[IU]/mL

## 2018-12-30 NOTE — Patient Instructions (Signed)
Health Maintenance, Female Adopting a healthy lifestyle and getting preventive care are important in promoting health and wellness. Ask your health care provider about:  The right schedule for you to have regular tests and exams.  Things you can do on your own to prevent diseases and keep yourself healthy. What should I know about diet, weight, and exercise? Eat a healthy diet   Eat a diet that includes plenty of vegetables, fruits, low-fat dairy products, and lean protein.  Do not eat a lot of foods that are high in solid fats, added sugars, or sodium. Maintain a healthy weight Body mass index (BMI) is used to identify weight problems. It estimates body fat based on height and weight. Your health care provider can help determine your BMI and help you achieve or maintain a healthy weight. Get regular exercise Get regular exercise. This is one of the most important things you can do for your health. Most adults should:  Exercise for at least 150 minutes each week. The exercise should increase your heart rate and make you sweat (moderate-intensity exercise).  Do strengthening exercises at least twice a week. This is in addition to the moderate-intensity exercise.  Spend less time sitting. Even light physical activity can be beneficial. Watch cholesterol and blood lipids Have your blood tested for lipids and cholesterol at 51 years of age, then have this test every 5 years. Have your cholesterol levels checked more often if:  Your lipid or cholesterol levels are high.  You are older than 51 years of age.  You are at high risk for heart disease. What should I know about cancer screening? Depending on your health history and family history, you may need to have cancer screening at various ages. This may include screening for:  Breast cancer.  Cervical cancer.  Colorectal cancer.  Skin cancer.  Lung cancer. What should I know about heart disease, diabetes, and high blood  pressure? Blood pressure and heart disease  High blood pressure causes heart disease and increases the risk of stroke. This is more likely to develop in people who have high blood pressure readings, are of African descent, or are overweight.  Have your blood pressure checked: ? Every 3-5 years if you are 18-39 years of age. ? Every year if you are 40 years old or older. Diabetes Have regular diabetes screenings. This checks your fasting blood sugar level. Have the screening done:  Once every three years after age 40 if you are at a normal weight and have a low risk for diabetes.  More often and at a younger age if you are overweight or have a high risk for diabetes. What should I know about preventing infection? Hepatitis B If you have a higher risk for hepatitis B, you should be screened for this virus. Talk with your health care provider to find out if you are at risk for hepatitis B infection. Hepatitis C Testing is recommended for:  Everyone born from 1945 through 1965.  Anyone with known risk factors for hepatitis C. Sexually transmitted infections (STIs)  Get screened for STIs, including gonorrhea and chlamydia, if: ? You are sexually active and are younger than 51 years of age. ? You are older than 51 years of age and your health care provider tells you that you are at risk for this type of infection. ? Your sexual activity has changed since you were last screened, and you are at increased risk for chlamydia or gonorrhea. Ask your health care provider if   you are at risk.  Ask your health care provider about whether you are at high risk for HIV. Your health care provider may recommend a prescription medicine to help prevent HIV infection. If you choose to take medicine to prevent HIV, you should first get tested for HIV. You should then be tested every 3 months for as long as you are taking the medicine. Pregnancy  If you are about to stop having your period (premenopausal) and  you may become pregnant, seek counseling before you get pregnant.  Take 400 to 800 micrograms (mcg) of folic acid every day if you become pregnant.  Ask for birth control (contraception) if you want to prevent pregnancy. Osteoporosis and menopause Osteoporosis is a disease in which the bones lose minerals and strength with aging. This can result in bone fractures. If you are 65 years old or older, or if you are at risk for osteoporosis and fractures, ask your health care provider if you should:  Be screened for bone loss.  Take a calcium or vitamin D supplement to lower your risk of fractures.  Be given hormone replacement therapy (HRT) to treat symptoms of menopause. Follow these instructions at home: Lifestyle  Do not use any products that contain nicotine or tobacco, such as cigarettes, e-cigarettes, and chewing tobacco. If you need help quitting, ask your health care provider.  Do not use street drugs.  Do not share needles.  Ask your health care provider for help if you need support or information about quitting drugs. Alcohol use  Do not drink alcohol if: ? Your health care provider tells you not to drink. ? You are pregnant, may be pregnant, or are planning to become pregnant.  If you drink alcohol: ? Limit how much you use to 0-1 drink a day. ? Limit intake if you are breastfeeding.  Be aware of how much alcohol is in your drink. In the [place] one drink equals one 12 oz bottle of beer (355 mL), one 5 oz glass of wine (148 mL), or one 1 oz glass of hard liquor (44 mL). General instructions  Schedule regular health, dental, and eye exams.  Stay current with your vaccines.  Tell your health care provider if: ? You often feel depressed. ? You have ever been abused or do not feel safe at home. Summary  Adopting a healthy lifestyle and getting preventive care are important in promoting health and wellness.  Follow your health care provider's instructions about healthy  diet, exercising, and getting tested or screened for diseases.  Follow your health care provider's instructions on monitoring your cholesterol and blood pressure. This information is not intended to replace advice given to you by your health care provider. Make sure you discuss any questions you have with your health care provider. Document Released: 10/09/2010 Document Revised: 03/19/2018 Document Reviewed: 03/19/2018 Elsevier Patient Education  2020 Elsevier Inc.  

## 2018-12-30 NOTE — Progress Notes (Signed)
Leslie Tate 26-May-1965 [id number]    History:    Presents for annual exam.  Amenorrhea on Loestrin.  Normal Pap and mammogram history.  03/2018 2 benign colon polyps, 5 year followup.  Past medical history, past surgical history, family history and social history were all reviewed and documented in the EPIC chart.  Works at [employer].  Has 2 labs.  Husband recently retired from [employer].  ROS:  A ROS was performed and pertinent positives and negatives are included.  Exam:  Vitals:   12/30/18 0905  BP: 118/76  Weight: 145 lb (65.8 kg)  Height: 5\' 3"  (1.6 m)   Body mass index is 25.69 kg/m.   General appearance:  Normal Thyroid:  Symmetrical, normal in size, without palpable masses or nodularity. Respiratory  Auscultation:  Clear without wheezing or rhonchi Cardiovascular  Auscultation:  Regular rate, without rubs, murmurs or gallops  Edema/varicosities:  Not grossly evident Abdominal  Soft,nontender, without masses, guarding or rebound.  Liver/spleen:  No organomegaly noted  Hernia:  None appreciated  Skin  Inspection:  Grossly normal   Breasts: Examined lying and sitting.     Right: Without masses, retractions, discharge or axillary adenopathy.     Left: Without masses, retractions, discharge or axillary adenopathy. Gentitourinary   Inguinal/mons:  Normal without inguinal adenopathy  External genitalia:  Normal  BUS/Urethra/Skene's glands:  Normal  Vagina:  Normal  Cervix:  Normal  Uterus:   normal in size, shape and contour.  Midline and mobile  Adnexa/parametria:     Rt: Without masses or tenderness.   Lt: Without masses or tenderness.  Anus and perineum: Normal  Digital rectal exam: Normal sphincter tone without palpated masses or tenderness  Assessment/Plan:  53 y.o. MWF G1P0 for annual exam with no complaints.  Amenorrhea: Loestrin no menopausal symptoms  Plan: Loestrin prescription, proper use, slight risk for blood clots and strokes reviewed will  continue another year we will check [place].  SBEs, continue annual screening mammogram, calcium rich foods, vitamin D 2000 daily encouraged.  Reviewed importance of continuing regular exercise, weightbearing and balance type encouraged.  Had screening colonoscopy this past year benign colon polyps 5-year follow-up.  CBC, CMP, lipid panel, Pap normal 2019, new screening guidelines reviewed.    Huel Cote [facility], 9:10 AM 12/30/2018

## 2019-02-03 ENCOUNTER — Ambulatory Visit
Admission: RE | Admit: 2019-02-03 | Discharge: 2019-02-03 | Disposition: A | Discharge status: Home / Self Care | Payer: 59 | Source: Ambulatory Visit | Attending: Women's Health | Admitting: Women's Health

## 2019-02-03 ENCOUNTER — Other Ambulatory Visit: Payer: Self-pay

## 2019-02-03 DIAGNOSIS — Z1231 Encounter for screening mammogram for malignant neoplasm of breast: Secondary | ICD-10-CM

## 2019-03-04 ENCOUNTER — Encounter: Payer: Self-pay | Admitting: Women's Health

## 2019-03-04 NOTE — Progress Notes (Signed)
FAXED AND SENT COPY TO PT

## 2019-03-09 ENCOUNTER — Other Ambulatory Visit: Payer: Self-pay

## 2019-03-09 DIAGNOSIS — Z3041 Encounter for surveillance of contraceptive pills: Secondary | ICD-10-CM

## 2019-03-09 MED ORDER — NORETHIN ACE-ETH ESTRAD-FE 1-20 MG-MCG PO TABS: 1.0000 | ORAL_TABLET | Freq: Every day | ORAL | 4 refills | Status: DC

## 2020-01-04 ENCOUNTER — Other Ambulatory Visit: Payer: Self-pay | Admitting: Nurse Practitioner

## 2020-01-04 DIAGNOSIS — Z1231 Encounter for screening mammogram for malignant neoplasm of breast: Secondary | ICD-10-CM

## 2020-01-05 ENCOUNTER — Ambulatory Visit (INDEPENDENT_AMBULATORY_CARE_PROVIDER_SITE_OTHER): Payer: 59 | Admitting: Nurse Practitioner

## 2020-01-05 ENCOUNTER — Other Ambulatory Visit: Payer: Self-pay

## 2020-01-05 ENCOUNTER — Encounter: Payer: Self-pay | Admitting: Nurse Practitioner

## 2020-01-05 VITALS — BP 118/78 | Ht 63.0 in | Wt 139.0 lb

## 2020-01-05 DIAGNOSIS — N951 Menopausal and female climacteric states: Secondary | ICD-10-CM

## 2020-01-05 DIAGNOSIS — Z3041 Encounter for surveillance of contraceptive pills: Secondary | ICD-10-CM | POA: Diagnosis not present

## 2020-01-05 DIAGNOSIS — Z01419 Encounter for gynecological examination (general) (routine) without abnormal findings: Secondary | ICD-10-CM

## 2020-01-05 MED ORDER — NORETHIN ACE-ETH ESTRAD-FE 1-20 MG-MCG PO TABS: 1.0000 | ORAL_TABLET | Freq: Every day | ORAL | 4 refills | Status: DC

## 2020-01-05 NOTE — Progress Notes (Signed)
   Leslie Tate [birth year] [id number]   History:  54 y.o. G0 presents for annual exam without GYN complaints. Amenorrheic on OCPs. Some mild menopausal symptoms, mostly mood changes and insomnia.  [place] one year ago was 2.8. Takes Xanax rarely for sleep and anxiety. Normal pap and mammogram history.   Gynecologic History No LMP recorded (lmp unknown). (Menstrual status: Oral contraceptives).   Contraception: OCP (estrogen/progesterone) Last Pap: 12/25/2017. Results were: normal Last mammogram: 02/03/2019. Results were: normal Last colonoscopy: 03/18/2018. Results were: polyp, 5 year follow up  Past medical history, past surgical history, family history and social history were all reviewed and documented in the EPIC chart.  ROS:  A ROS was performed and pertinent positives and negatives are included.  Exam:  Vitals:   01/05/20 0837  BP: 118/78  Weight: 139 lb (63 kg)  Height: 5\' 3"  (1.6 m)   Body mass index is 24.62 kg/m.  General appearance:  Normal Thyroid:  Symmetrical, normal in size, without palpable masses or nodularity. Respiratory  Auscultation:  Clear without wheezing or rhonchi Cardiovascular  Auscultation:  Regular rate, without rubs, murmurs or gallops  Edema/varicosities:  Not grossly evident Abdominal  Soft,nontender, without masses, guarding or rebound.  Liver/spleen:  No organomegaly noted  Hernia:  None appreciated  Skin  Inspection:  Grossly normal   Breasts: Examined lying and sitting. Reduction.   Right: Without masses, retractions, discharge or axillary adenopathy.   Left: Without masses, retractions, discharge or axillary adenopathy. Gentitourinary   Inguinal/mons:  Normal without inguinal adenopathy  External genitalia:  Normal  BUS/Urethra/Skene's glands:  Normal  Vagina:  Normal  Cervix:  Normal  Uterus:  Normal in size, shape and contour.  Midline and mobile  Adnexa/parametria:     Rt: Without masses or tenderness.   Lt: Without  masses or tenderness.  Anus and perineum: Normal  Digital rectal exam: Normal sphincter tone without palpated masses or tenderness  Assessment/Plan:  54 y.o. G0 for annual exam.   Well female exam with routine gynecological exam - Plan: CBC with Differential/Platelet, Comprehensive metabolic panel, Lipid panel. Education provided on SBEs, importance of preventative screenings, current guidelines, high calcium diet, regular exercise, and multivitamin daily.   Encounter for surveillance of contraceptive pills - Plan: norethindrone-ethinyl estradiol (JUNEL FE 1/20) 1-20 MG-MCG tablet. Taking as prescribed. Amenorrheic, has been for years. Refill x 1 year provided.   Menopausal symptoms - Plan: Follicle stimulating hormone. 2.8 one year ago.   Follow up in 1 year for annual.      [name] [facility], 8:54 AM 01/05/2020

## 2020-01-05 NOTE — Patient Instructions (Signed)
Health Maintenance, Female Adopting a healthy lifestyle and getting preventive care are important in promoting health and wellness. Ask your health care provider about:  The right schedule for you to have regular tests and exams.  Things you can do on your own to prevent diseases and keep yourself healthy. What should I know about diet, weight, and exercise? Eat a healthy diet   Eat a diet that includes plenty of vegetables, fruits, low-fat dairy products, and lean protein.  Do not eat a lot of foods that are high in solid fats, added sugars, or sodium. Maintain a healthy weight Body mass index (BMI) is used to identify weight problems. It estimates body fat based on height and weight. Your health care provider can help determine your BMI and help you achieve or maintain a healthy weight. Get regular exercise Get regular exercise. This is one of the most important things you can do for your health. Most adults should:  Exercise for at least 150 minutes each week. The exercise should increase your heart rate and make you sweat (moderate-intensity exercise).  Do strengthening exercises at least twice a week. This is in addition to the moderate-intensity exercise.  Spend less time sitting. Even light physical activity can be beneficial. Watch cholesterol and blood lipids Have your blood tested for lipids and cholesterol at 52 years of age, then have this test every 5 years. Have your cholesterol levels checked more often if:  Your lipid or cholesterol levels are high.  You are older than 52 years of age.  You are at high risk for heart disease. What should I know about cancer screening? Depending on your health history and family history, you may need to have cancer screening at various ages. This may include screening for:  Breast cancer.  Cervical cancer.  Colorectal cancer.  Skin cancer.  Lung cancer. What should I know about heart disease, diabetes, and high blood  pressure? Blood pressure and heart disease  High blood pressure causes heart disease and increases the risk of stroke. This is more likely to develop in people who have high blood pressure readings, are of African descent, or are overweight.  Have your blood pressure checked: ? Every 3-5 years if you are 18-39 years of age. ? Every year if you are 40 years old or older. Diabetes Have regular diabetes screenings. This checks your fasting blood sugar level. Have the screening done:  Once every three years after age 40 if you are at a normal weight and have a low risk for diabetes.  More often and at a younger age if you are overweight or have a high risk for diabetes. What should I know about preventing infection? Hepatitis B If you have a higher risk for hepatitis B, you should be screened for this virus. Talk with your health care provider to find out if you are at risk for hepatitis B infection. Hepatitis C Testing is recommended for:  Everyone born from 1945 through 1965.  Anyone with known risk factors for hepatitis C. Sexually transmitted infections (STIs)  Get screened for STIs, including gonorrhea and chlamydia, if: ? You are sexually active and are younger than 52 years of age. ? You are older than 52 years of age and your health care provider tells you that you are at risk for this type of infection. ? Your sexual activity has changed since you were last screened, and you are at increased risk for chlamydia or gonorrhea. Ask your health care provider if   you are at risk.  Ask your health care provider about whether you are at high risk for HIV. Your health care provider may recommend a prescription medicine to help prevent HIV infection. If you choose to take medicine to prevent HIV, you should first get tested for HIV. You should then be tested every 3 months for as long as you are taking the medicine. Pregnancy  If you are about to stop having your period (premenopausal) and  you may become pregnant, seek counseling before you get pregnant.  Take 400 to 800 micrograms (mcg) of folic acid every day if you become pregnant.  Ask for birth control (contraception) if you want to prevent pregnancy. Osteoporosis and menopause Osteoporosis is a disease in which the bones lose minerals and strength with aging. This can result in bone fractures. If you are 65 years old or older, or if you are at risk for osteoporosis and fractures, ask your health care provider if you should:  Be screened for bone loss.  Take a calcium or vitamin D supplement to lower your risk of fractures.  Be given hormone replacement therapy (HRT) to treat symptoms of menopause. Follow these instructions at home: Lifestyle  Do not use any products that contain nicotine or tobacco, such as cigarettes, e-cigarettes, and chewing tobacco. If you need help quitting, ask your health care provider.  Do not use street drugs.  Do not share needles.  Ask your health care provider for help if you need support or information about quitting drugs. Alcohol use  Do not drink alcohol if: ? Your health care provider tells you not to drink. ? You are pregnant, may be pregnant, or are planning to become pregnant.  If you drink alcohol: ? Limit how much you use to 0-1 drink a day. ? Limit intake if you are breastfeeding.  Be aware of how much alcohol is in your drink. In the [place] one drink equals one 12 oz bottle of beer (355 mL), one 5 oz glass of wine (148 mL), or one 1 oz glass of hard liquor (44 mL). General instructions  Schedule regular health, dental, and eye exams.  Stay current with your vaccines.  Tell your health care provider if: ? You often feel depressed. ? You have ever been abused or do not feel safe at home. Summary  Adopting a healthy lifestyle and getting preventive care are important in promoting health and wellness.  Follow your health care provider's instructions about healthy  diet, exercising, and getting tested or screened for diseases.  Follow your health care provider's instructions on monitoring your cholesterol and blood pressure. This information is not intended to replace advice given to you by your health care provider. Make sure you discuss any questions you have with your health care provider. Document Revised: 03/19/2018 Document Reviewed: 03/19/2018 Elsevier Patient Education  2020 Elsevier Inc.  

## 2020-01-06 LAB — COMPREHENSIVE METABOLIC PANEL
AG Ratio: 1.9 (calc) (ref 1.0–2.5)
ALT: 21 U/L (ref 6–29)
AST: 19 U/L (ref 10–35)
Albumin: 4.3 g/dL (ref 3.6–5.1)
Alkaline phosphatase (APISO): 49 U/L (ref 37–153)
BUN: 13 mg/dL (ref 7–25)
CO2: 22 mmol/L (ref 20–32)
Calcium: 9.3 mg/dL (ref 8.6–10.4)
Chloride: 103 mmol/L (ref 98–110)
Creat: 0.69 mg/dL (ref 0.50–1.05)
Globulin: 2.3 g/dL (calc) (ref 1.9–3.7)
Glucose, Bld: 94 mg/dL (ref 65–99)
Potassium: 4.6 mmol/L (ref 3.5–5.3)
Sodium: 137 mmol/L (ref 135–146)
Total Bilirubin: 1.1 mg/dL (ref 0.2–1.2)
Total Protein: 6.6 g/dL (ref 6.1–8.1)

## 2020-01-06 LAB — CBC WITH DIFFERENTIAL/PLATELET
Absolute Monocytes: 525 cells/uL (ref 200–950)
Basophils Absolute: 69 cells/uL (ref 0–200)
Basophils Relative: 0.7 %
Eosinophils Absolute: 178 cells/uL (ref 15–500)
Eosinophils Relative: 1.8 %
HCT: 40.6 % (ref 35.0–45.0)
Hemoglobin: 13.7 g/dL (ref 11.7–15.5)
Lymphs Abs: 2594 cells/uL (ref 850–3900)
MCH: 31.9 pg (ref 27.0–33.0)
MCHC: 33.7 g/dL (ref 32.0–36.0)
MCV: 94.4 fL (ref 80.0–100.0)
MPV: 10.3 fL (ref 7.5–12.5)
Monocytes Relative: 5.3 %
Neutro Abs: 6534 cells/uL (ref 1500–7800)
Neutrophils Relative %: 66 %
Platelets: 259 10*3/uL (ref 140–400)
RBC: 4.3 10*6/uL (ref 3.80–5.10)
RDW: 11.8 % (ref 11.0–15.0)
Total Lymphocyte: 26.2 %
WBC: 9.9 10*3/uL (ref 3.8–10.8)

## 2020-01-06 LAB — FOLLICLE STIMULATING HORMONE: FSH: 10.8 m[IU]/mL

## 2020-01-06 LAB — LIPID PANEL
Cholesterol: 219 mg/dL — ABNORMAL HIGH (ref <200)
HDL: 90 mg/dL (ref >=50)
LDL Cholesterol (Calc): 105 mg/dL (calc) — ABNORMAL HIGH
Non-HDL Cholesterol (Calc): 129 mg/dL (calc) (ref <130)
Total CHOL/HDL Ratio: 2.4 (calc) (ref <5.0)
Triglycerides: 140 mg/dL (ref <150)

## 2020-02-04 ENCOUNTER — Other Ambulatory Visit: Payer: Self-pay

## 2020-02-04 ENCOUNTER — Ambulatory Visit
Admission: RE | Admit: 2020-02-04 | Discharge: 2020-02-04 | Disposition: A | Discharge status: Home / Self Care | Payer: 59 | Source: Ambulatory Visit | Attending: Nurse Practitioner | Admitting: Nurse Practitioner

## 2020-02-04 DIAGNOSIS — Z1231 Encounter for screening mammogram for malignant neoplasm of breast: Secondary | ICD-10-CM

## 2020-08-08 ENCOUNTER — Other Ambulatory Visit: Payer: Self-pay | Admitting: *Deleted

## 2020-08-08 MED ORDER — ALPRAZOLAM 0.25 MG PO TABS: 0.2500 mg | ORAL_TABLET | Freq: Every evening | ORAL | 0 refills | Status: DC | PRN

## 2020-08-08 NOTE — Telephone Encounter (Signed)
Patient called requesting refill on Xanax 0.25 mg tablet, rare use, dealing with elderly parents. Helps with sleep at night. Please advise

## 2021-01-05 ENCOUNTER — Ambulatory Visit (INDEPENDENT_AMBULATORY_CARE_PROVIDER_SITE_OTHER): Payer: BC Managed Care – PPO | Admitting: Nurse Practitioner

## 2021-01-05 ENCOUNTER — Other Ambulatory Visit (HOSPITAL_COMMUNITY)
Admission: RE | Admit: 2021-01-05 | Discharge: 2021-01-05 | Disposition: A | Discharge status: Home / Self Care | Payer: BC Managed Care – PPO | Source: Ambulatory Visit | Attending: Nurse Practitioner | Admitting: Nurse Practitioner

## 2021-01-05 ENCOUNTER — Encounter: Payer: Self-pay | Admitting: Nurse Practitioner

## 2021-01-05 ENCOUNTER — Other Ambulatory Visit: Payer: Self-pay

## 2021-01-05 ENCOUNTER — Other Ambulatory Visit: Payer: Self-pay | Admitting: Nurse Practitioner

## 2021-01-05 VITALS — BP 110/64 | Ht 62.5 in | Wt 140.0 lb

## 2021-01-05 DIAGNOSIS — N912 Amenorrhea, unspecified: Secondary | ICD-10-CM

## 2021-01-05 DIAGNOSIS — Z3041 Encounter for surveillance of contraceptive pills: Secondary | ICD-10-CM

## 2021-01-05 DIAGNOSIS — Z1231 Encounter for screening mammogram for malignant neoplasm of breast: Secondary | ICD-10-CM

## 2021-01-05 DIAGNOSIS — Z01419 Encounter for gynecological examination (general) (routine) without abnormal findings: Secondary | ICD-10-CM | POA: Diagnosis not present

## 2021-01-05 DIAGNOSIS — Z23 Encounter for immunization: Secondary | ICD-10-CM

## 2021-01-05 DIAGNOSIS — E785 Hyperlipidemia, unspecified: Secondary | ICD-10-CM

## 2021-01-05 MED ORDER — NORETHIN ACE-ETH ESTRAD-FE 1-20 MG-MCG PO TABS: 1.0000 | ORAL_TABLET | Freq: Every day | ORAL | 4 refills | Status: DC

## 2021-01-05 MED ORDER — SHINGRIX 50 MCG/0.5ML IM SUSR: 0.5000 mL | Freq: Once | INTRAMUSCULAR | 0 refills | Status: AC

## 2021-01-05 NOTE — Progress Notes (Signed)
   Leslie Tate 08-Apr-1966 [id number]   History:  55 y.o. G0 presents for annual exam. Amenorrheic on OCPs. Some mild menopausal symptoms, mostly mood changes and night sweats. [place] 10 one year ago.  Normal pap and mammogram history. Takes Xanax rarely for sleep and anxiety. Wants to get shingles vaccine.   Gynecologic History No LMP recorded. (Menstrual status: Oral contraceptives).   Contraception: OCP (estrogen/progesterone)  Health maintenance Last Pap: 12/25/2017. Results were: normal Last mammogram: 02/04/2020. Results were: normal Last colonoscopy: 03/18/2018. Results were: polyp, 5 year follow up Last Dexa: Not indicated  Past medical history, past surgical history, family history and social history were all reviewed and documented in the EPIC chart. Married. Works for [employer].   ROS:  A ROS was performed and pertinent positives and negatives are included.  Exam:  Vitals:   01/05/21 0829  BP: 110/64  Weight: 140 lb (63.5 kg)  Height: 5' 2.5" (1.588 m)    Body mass index is 25.2 kg/m.  General appearance:  Normal Thyroid:  Symmetrical, normal in size, without palpable masses or nodularity. Respiratory  Auscultation:  Clear without wheezing or rhonchi Cardiovascular  Auscultation:  Regular rate, without rubs, murmurs or gallops  Edema/varicosities:  Not grossly evident Abdominal  Soft,nontender, without masses, guarding or rebound.  Liver/spleen:  No organomegaly noted  Hernia:  None appreciated  Skin  Inspection:  Grossly normal   Breasts: Examined lying and sitting. Reduction.   Right: Without masses, retractions, discharge or axillary adenopathy.   Left: Without masses, retractions, discharge or axillary adenopathy. Gentitourinary   Inguinal/mons:  Normal without inguinal adenopathy  External genitalia:  Normal  BUS/Urethra/Skene's glands:  Normal  Vagina:  Normal  Cervix:  Normal  Uterus:  Normal in size, shape and contour.  Midline and  mobile  Adnexa/parametria:     Rt: Without masses or tenderness.   Lt: Without masses or tenderness.  Anus and perineum: Normal  Digital rectal exam: Normal sphincter tone without palpated masses or tenderness  Assessment/Plan:  55 y.o. G0 for annual exam.   Well female exam with routine gynecological exam - Plan: Cytology - PAP( [place]), CBC with Differential/Platelet, Comprehensive metabolic panel. Education provided on SBEs, importance of preventative screenings, current guidelines, high calcium diet, regular exercise, and multivitamin daily.   Encounter for surveillance of contraceptive pills - Plan: norethindrone-ethinyl estradiol-FE (JUNEL FE 1/20) 1-20 MG-MCG tablet daily. Has not had withdrawal bleeding for years. [place] 10 one year ago. Refill x 1 year provided.   Hyperlipidemia, unspecified hyperlipidemia type - Plan: Lipid panel  Amenorrhea - Plan: Follicle stimulating hormone. Will recheck [facility] to assess menopausal status. She is aware this can fluctuate significantly during perimenopausal phase.   Need for shingles vaccine - Plan: Zoster Vaccine Adjuvanted [facility]) injection. She will get at her pharmacy.   Screening for cervical cancer - Normal Pap history. Pap today.   Screening for breast cancer - Normal mammogram history.  Continue annual screenings.  Normal breast exam today.  Screening for colon cancer - 2019 colonoscopy. Will repeat at GI's recommended interval.   Follow up in 1 year for annual.      [name] [facility], 8:54 AM 01/05/2021

## 2021-01-06 LAB — LIPID PANEL
Cholesterol: 216 mg/dL — ABNORMAL HIGH (ref <200)
HDL: 94 mg/dL (ref >=50)
LDL Cholesterol (Calc): 102 mg/dL (calc) — ABNORMAL HIGH
Non-HDL Cholesterol (Calc): 122 mg/dL (calc) (ref <130)
Total CHOL/HDL Ratio: 2.3 (calc) (ref <5.0)
Triglycerides: 108 mg/dL (ref <150)

## 2021-01-06 LAB — COMPREHENSIVE METABOLIC PANEL
AG Ratio: 2 (calc) (ref 1.0–2.5)
ALT: 20 U/L (ref 6–29)
AST: 16 U/L (ref 10–35)
Albumin: 4.5 g/dL (ref 3.6–5.1)
Alkaline phosphatase (APISO): 47 U/L (ref 37–153)
BUN: 12 mg/dL (ref 7–25)
CO2: 24 mmol/L (ref 20–32)
Calcium: 9.4 mg/dL (ref 8.6–10.4)
Chloride: 103 mmol/L (ref 98–110)
Creat: 0.68 mg/dL (ref 0.50–1.03)
Globulin: 2.3 g/dL (calc) (ref 1.9–3.7)
Glucose, Bld: 92 mg/dL (ref 65–99)
Potassium: 4.2 mmol/L (ref 3.5–5.3)
Sodium: 135 mmol/L (ref 135–146)
Total Bilirubin: 1.3 mg/dL — ABNORMAL HIGH (ref 0.2–1.2)
Total Protein: 6.8 g/dL (ref 6.1–8.1)

## 2021-01-06 LAB — CBC WITH DIFFERENTIAL/PLATELET
Absolute Monocytes: 473 cells/uL (ref 200–950)
Basophils Absolute: 60 cells/uL (ref 0–200)
Basophils Relative: 0.7 %
Eosinophils Absolute: 181 cells/uL (ref 15–500)
Eosinophils Relative: 2.1 %
HCT: 42.6 % (ref 35.0–45.0)
Hemoglobin: 13.9 g/dL (ref 11.7–15.5)
Lymphs Abs: 2468 cells/uL (ref 850–3900)
MCH: 31.2 pg (ref 27.0–33.0)
MCHC: 32.6 g/dL (ref 32.0–36.0)
MCV: 95.7 fL (ref 80.0–100.0)
MPV: 10.5 fL (ref 7.5–12.5)
Monocytes Relative: 5.5 %
Neutro Abs: 5418 cells/uL (ref 1500–7800)
Neutrophils Relative %: 63 %
Platelets: 257 10*3/uL (ref 140–400)
RBC: 4.45 10*6/uL (ref 3.80–5.10)
RDW: 11.8 % (ref 11.0–15.0)
Total Lymphocyte: 28.7 %
WBC: 8.6 10*3/uL (ref 3.8–10.8)

## 2021-01-06 LAB — CYTOLOGY - PAP: Diagnosis: NEGATIVE

## 2021-01-06 LAB — FOLLICLE STIMULATING HORMONE: FSH: 4.2 m[IU]/mL

## 2021-02-06 ENCOUNTER — Ambulatory Visit
Admission: RE | Admit: 2021-02-06 | Discharge: 2021-02-06 | Disposition: A | Discharge status: Home / Self Care | Payer: BC Managed Care – PPO | Source: Ambulatory Visit | Attending: Nurse Practitioner | Admitting: Nurse Practitioner

## 2021-02-06 ENCOUNTER — Other Ambulatory Visit: Payer: Self-pay

## 2021-02-06 DIAGNOSIS — Z1231 Encounter for screening mammogram for malignant neoplasm of breast: Secondary | ICD-10-CM

## 2021-11-03 ENCOUNTER — Telehealth: Payer: Self-pay | Admitting: *Deleted

## 2021-11-03 ENCOUNTER — Other Ambulatory Visit: Payer: Self-pay | Admitting: Nurse Practitioner

## 2021-11-03 DIAGNOSIS — F419 Anxiety disorder, unspecified: Secondary | ICD-10-CM

## 2021-11-03 MED ORDER — ALPRAZOLAM 0.25 MG PO TABS: 0.2500 mg | ORAL_TABLET | Freq: Every evening | ORAL | 0 refills | Status: DC | PRN

## 2021-11-03 NOTE — Telephone Encounter (Signed)
Rx sent. Thank.

## 2021-11-03 NOTE — Telephone Encounter (Signed)
Patient called asking for new Rx for Xanax, per note "Takes Xanax rarely for sleep and anxiety". Last annual exam was 12/2020. Rx can be sent to [facility] in chart. Please advise

## 2021-11-03 NOTE — Telephone Encounter (Signed)
Patient aware.

## 2021-12-04 ENCOUNTER — Other Ambulatory Visit: Payer: Self-pay | Admitting: Nurse Practitioner

## 2021-12-04 DIAGNOSIS — Z1231 Encounter for screening mammogram for malignant neoplasm of breast: Secondary | ICD-10-CM

## 2022-01-08 NOTE — Progress Notes (Signed)
Leslie Tate 1965-06-20 [id number]   History:  56 y.o. G0 presents for annual exam. Amenorrheic on COCs. Has not had menses in some time. Has some mild menopausal symptoms, mostly mood changes, insomnia and night sweats. Normal pap and mammogram history. Takes Xanax rarely for sleep and anxiety.  Gynecologic History No LMP recorded. (Menstrual status: Oral contraceptives).   Contraception: OCP (estrogen/progesterone) Sexually active: Yes  Health maintenance Last Pap: 01/05/2021. Results were: Normal, 3-year repeat Last mammogram: 02/06/2021. Results were: Normal, scheduled 11/1 Last colonoscopy: 03/18/2018. Results were: SSP, 5-year recall Last Dexa: Not indicated  Past medical history, past surgical history, family history and social history were all reviewed and documented in the EPIC chart. Married. Let go in July from [facility]. Plans to look for new position after [place] trip. Husband retired from [employer] but wants PT work.   ROS:  A ROS was performed and pertinent positives and negatives are included.  Exam:  Vitals:   01/09/22 0824  BP: 104/68  Weight: 138 lb (62.6 kg)  Height: 5' 2.25" (1.581 m)     Body mass index is 25.04 kg/m.  General appearance:  Normal Thyroid:  Symmetrical, normal in size, without palpable masses or nodularity. Respiratory  Auscultation:  Clear without wheezing or rhonchi Cardiovascular  Auscultation:  Regular rate, without rubs, murmurs or gallops  Edema/varicosities:  Not grossly evident Abdominal  Soft,nontender, without masses, guarding or rebound.  Liver/spleen:  No organomegaly noted  Hernia:  None appreciated  Skin  Inspection:  Grossly normal   Breasts: Examined lying and sitting. Reduction.   Right: Without masses, retractions, discharge or axillary adenopathy.   Left: Without masses, retractions, discharge or axillary adenopathy. Genitourinary   Inguinal/mons:  Normal without inguinal adenopathy  External  genitalia:  Normal appearing vulva with no masses, tenderness, or lesions  BUS/Urethra/Skene's glands:  Normal  Vagina:  Normal appearing with normal color and discharge, no lesions  Cervix:  Normal appearing without discharge or lesions  Uterus:  Normal in size, shape and contour.  Midline and mobile, nontender  Adnexa/parametria:     Rt: Normal in size, without masses or tenderness.   Lt: Normal in size, without masses or tenderness.  Anus and perineum: Normal  Digital rectal exam: Normal sphincter tone without palpated masses or tenderness  Patient informed chaperone available to be present for breast and pelvic exam. Patient has requested no chaperone to be present. Patient has been advised what will be completed during breast and pelvic exam.   Assessment/Plan:  56 y.o. G0 for annual exam.   Well female exam with routine gynecological exam - Plan: CBC with Differential/Platelet, Comprehensive metabolic panel, Hemoglobin A1c. Education provided on SBEs, importance of preventative screenings, current guidelines, high calcium diet, regular exercise, and multivitamin daily.   Encounter for surveillance of contraceptive pills - Plan: norethindrone-ethinyl estradiol-FE (JUNEL FE 1/20) 1-20 MG-MCG tablet daily. Amenorrheic for a while. [place] 4 last year. Will stop used and see if menses return. Would like refill just in case.   Hyperlipidemia, unspecified hyperlipidemia type - Plan: Lipid panel  Screening for cervical cancer - Normal Pap history. Will repeat at 3-year interval per guidelines.   Screening for breast cancer - Normal mammogram history.  Continue annual screenings. Scheduled 11/1.  Normal breast exam today.  Screening for colon cancer - 2019 colonoscopy. Will repeat at GI's recommended interval.   Screening for osteoporosis - Unsure of family history, adopted. Will plan DXA next year. Very active with [employer] 4 days  per week.   Follow up in 1 year for  annual.      [name] [facility], 8:55 AM 01/09/2022

## 2022-01-09 ENCOUNTER — Encounter: Payer: Self-pay | Admitting: Nurse Practitioner

## 2022-01-09 ENCOUNTER — Ambulatory Visit (INDEPENDENT_AMBULATORY_CARE_PROVIDER_SITE_OTHER): Payer: BC Managed Care – PPO | Admitting: Nurse Practitioner

## 2022-01-09 VITALS — BP 104/68 | Ht 62.25 in | Wt 138.0 lb

## 2022-01-09 DIAGNOSIS — E785 Hyperlipidemia, unspecified: Secondary | ICD-10-CM | POA: Diagnosis not present

## 2022-01-09 DIAGNOSIS — Z01419 Encounter for gynecological examination (general) (routine) without abnormal findings: Secondary | ICD-10-CM | POA: Diagnosis not present

## 2022-01-09 DIAGNOSIS — Z3041 Encounter for surveillance of contraceptive pills: Secondary | ICD-10-CM

## 2022-01-09 MED ORDER — NORETHIN ACE-ETH ESTRAD-FE 1-20 MG-MCG PO TABS: 1.0000 | ORAL_TABLET | Freq: Every day | ORAL | 4 refills | Status: DC

## 2022-01-10 LAB — CBC WITH DIFFERENTIAL/PLATELET
Absolute Monocytes: 451 cells/uL (ref 200–950)
Basophils Absolute: 60 cells/uL (ref 0–200)
Basophils Relative: 0.7 %
Eosinophils Absolute: 162 cells/uL (ref 15–500)
Eosinophils Relative: 1.9 %
HCT: 39.2 % (ref 35.0–45.0)
Hemoglobin: 13.3 g/dL (ref 11.7–15.5)
Lymphs Abs: 2423 cells/uL (ref 850–3900)
MCH: 31.7 pg (ref 27.0–33.0)
MCHC: 33.9 g/dL (ref 32.0–36.0)
MCV: 93.6 fL (ref 80.0–100.0)
MPV: 10.4 fL (ref 7.5–12.5)
Monocytes Relative: 5.3 %
Neutro Abs: 5406 cells/uL (ref 1500–7800)
Neutrophils Relative %: 63.6 %
Platelets: 260 10*3/uL (ref 140–400)
RBC: 4.19 10*6/uL (ref 3.80–5.10)
RDW: 11.9 % (ref 11.0–15.0)
Total Lymphocyte: 28.5 %
WBC: 8.5 10*3/uL (ref 3.8–10.8)

## 2022-01-10 LAB — COMPREHENSIVE METABOLIC PANEL
AG Ratio: 1.8 (calc) (ref 1.0–2.5)
ALT: 16 U/L (ref 6–29)
AST: 19 U/L (ref 10–35)
Albumin: 4.2 g/dL (ref 3.6–5.1)
Alkaline phosphatase (APISO): 43 U/L (ref 37–153)
BUN: 8 mg/dL (ref 7–25)
CO2: 23 mmol/L (ref 20–32)
Calcium: 9.6 mg/dL (ref 8.6–10.4)
Chloride: 103 mmol/L (ref 98–110)
Creat: 0.77 mg/dL (ref 0.50–1.03)
Globulin: 2.4 g/dL (calc) (ref 1.9–3.7)
Glucose, Bld: 93 mg/dL (ref 65–99)
Potassium: 4.7 mmol/L (ref 3.5–5.3)
Sodium: 136 mmol/L (ref 135–146)
Total Bilirubin: 0.9 mg/dL (ref 0.2–1.2)
Total Protein: 6.6 g/dL (ref 6.1–8.1)

## 2022-01-10 LAB — LIPID PANEL
Cholesterol: 210 mg/dL — ABNORMAL HIGH (ref <200)
HDL: 83 mg/dL (ref >=50)
LDL Cholesterol (Calc): 102 mg/dL (calc) — ABNORMAL HIGH
Non-HDL Cholesterol (Calc): 127 mg/dL (calc) (ref <130)
Total CHOL/HDL Ratio: 2.5 (calc) (ref <5.0)
Triglycerides: 158 mg/dL — ABNORMAL HIGH (ref <150)

## 2022-01-10 LAB — HEMOGLOBIN A1C
Hgb A1c MFr Bld: 5.1 % of total Hgb (ref <5.7)
Mean Plasma Glucose: 100 mg/dL
eAG (mmol/L): 5.5 mmol/L

## 2022-02-07 ENCOUNTER — Ambulatory Visit
Admission: RE | Admit: 2022-02-07 | Discharge: 2022-02-07 | Disposition: A | Discharge status: Home / Self Care | Payer: BC Managed Care – PPO | Source: Ambulatory Visit | Attending: Nurse Practitioner | Admitting: Nurse Practitioner

## 2022-02-07 DIAGNOSIS — Z1231 Encounter for screening mammogram for malignant neoplasm of breast: Secondary | ICD-10-CM

## 2022-02-09 ENCOUNTER — Other Ambulatory Visit: Payer: Self-pay | Admitting: Nurse Practitioner

## 2022-02-09 DIAGNOSIS — Z1231 Encounter for screening mammogram for malignant neoplasm of breast: Secondary | ICD-10-CM

## 2022-03-28 ENCOUNTER — Other Ambulatory Visit: Payer: Self-pay | Admitting: *Deleted

## 2022-03-28 DIAGNOSIS — F419 Anxiety disorder, unspecified: Secondary | ICD-10-CM

## 2022-03-28 MED ORDER — ALPRAZOLAM 0.25 MG PO TABS: 0.2500 mg | ORAL_TABLET | Freq: Every evening | ORAL | 0 refills | Status: DC | PRN

## 2022-03-28 NOTE — Telephone Encounter (Signed)
Patient called requesting refill on Xanax 0.25 mg tablet [facility]. Last filled in 11/03/21. Annual exam was 01/2022.

## 2022-05-16 IMAGING — MG MM DIGITAL SCREENING BILAT W/ TOMO AND CAD
8 series · 8 of 24 positions shown · non-contrast
Comparison: Previous exam(s).

CLINICAL DATA: Screening.

EXAM:
DIGITAL SCREENING BILATERAL MAMMOGRAM WITH TOMOSYNTHESIS AND CAD
TECHNIQUE: Bilateral screening digital craniocaudal and mediolateral oblique
mammograms were obtained. Bilateral screening digital breast
tomosynthesis was performed. The images were evaluated with
computer-aided detection.

[L CC synth-2D]
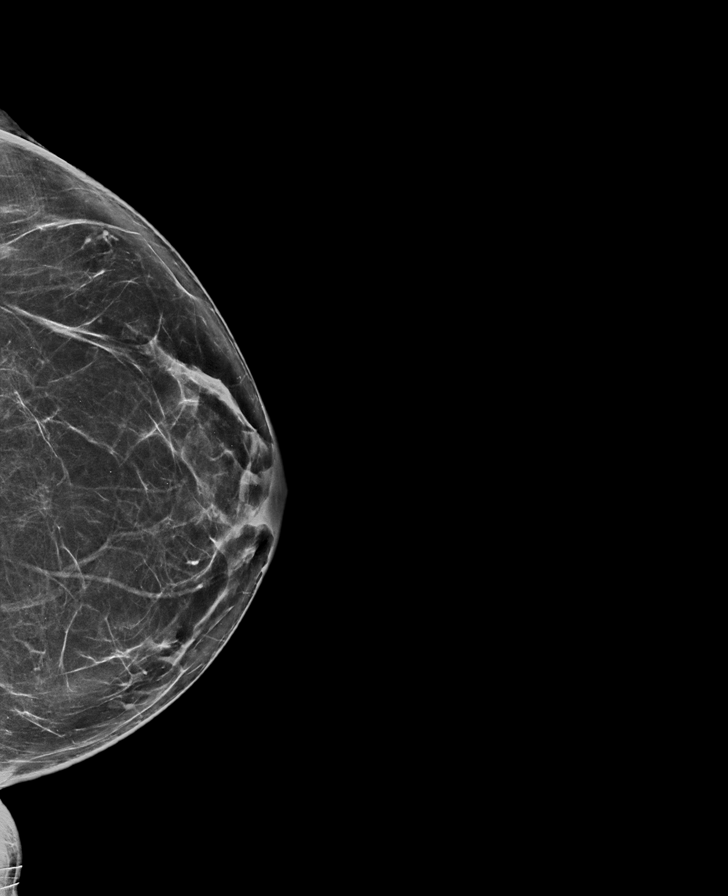

[R MLO synth-2D]
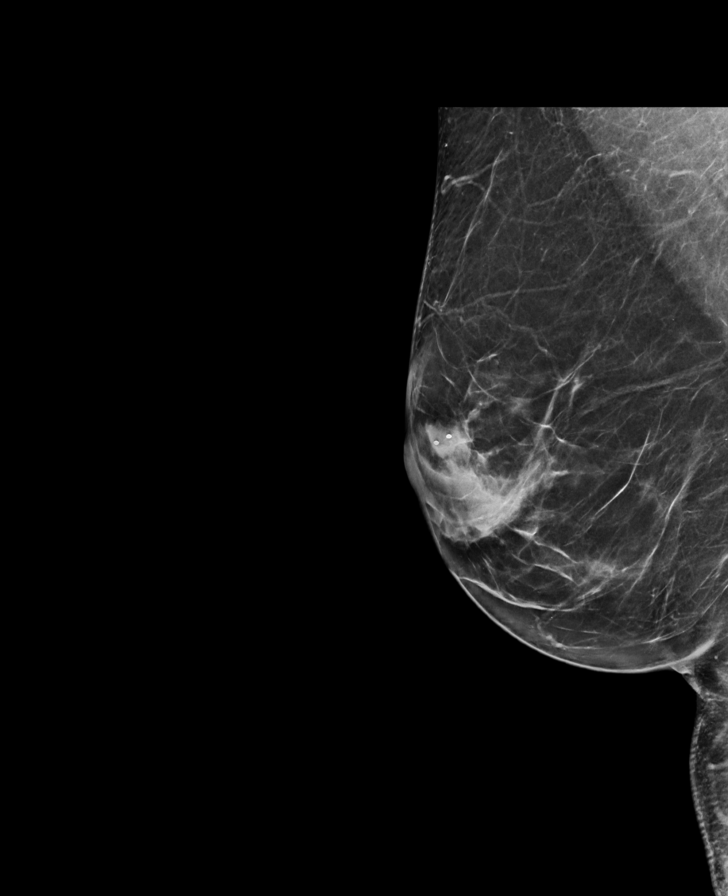

[R CC synth-2D]
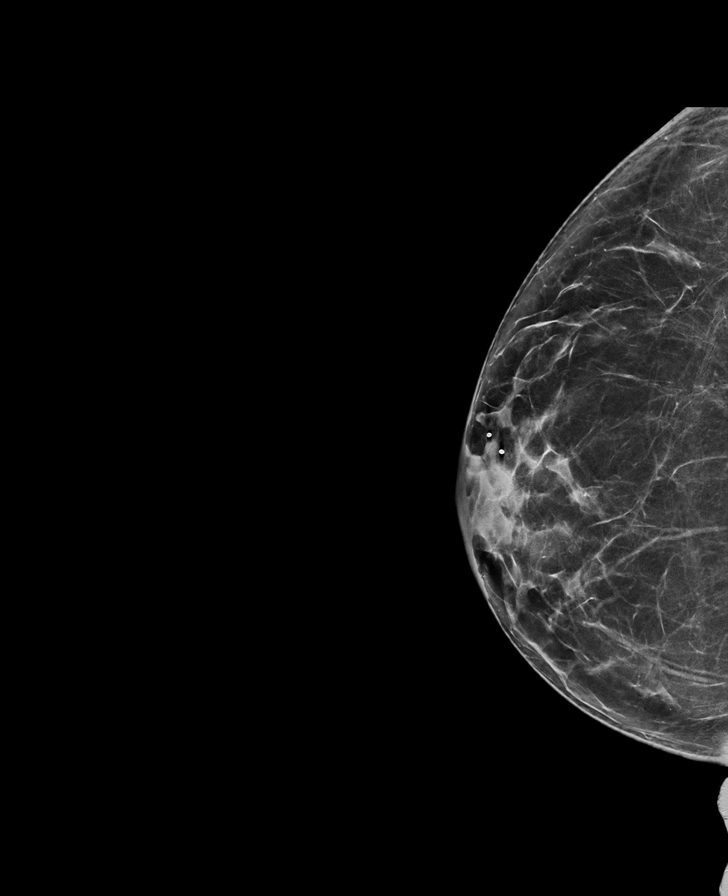

[L MLO synth-2D]
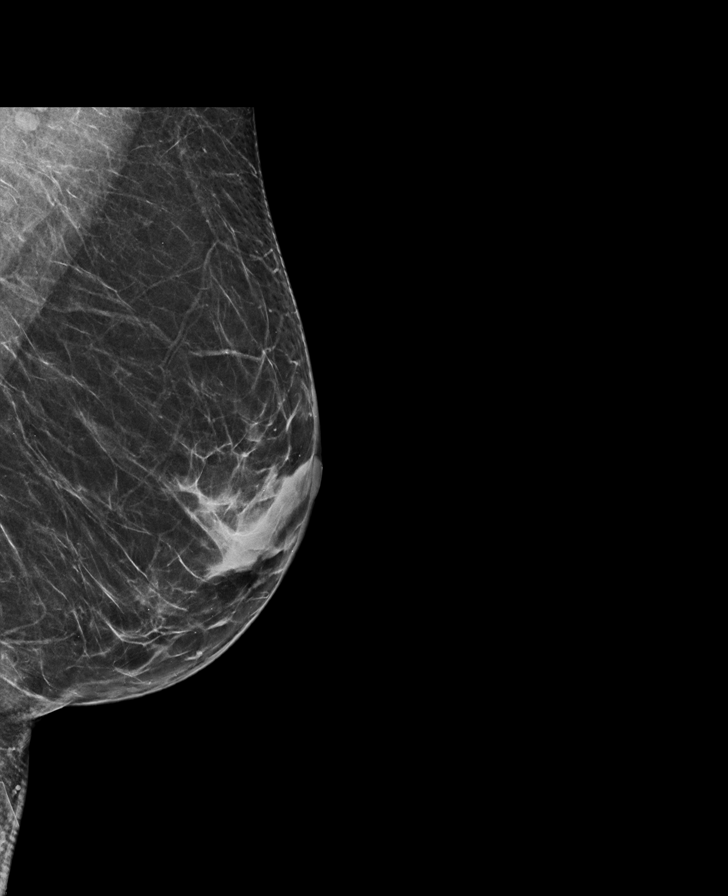

[L CC tomo · tomo slice 39/77.0]
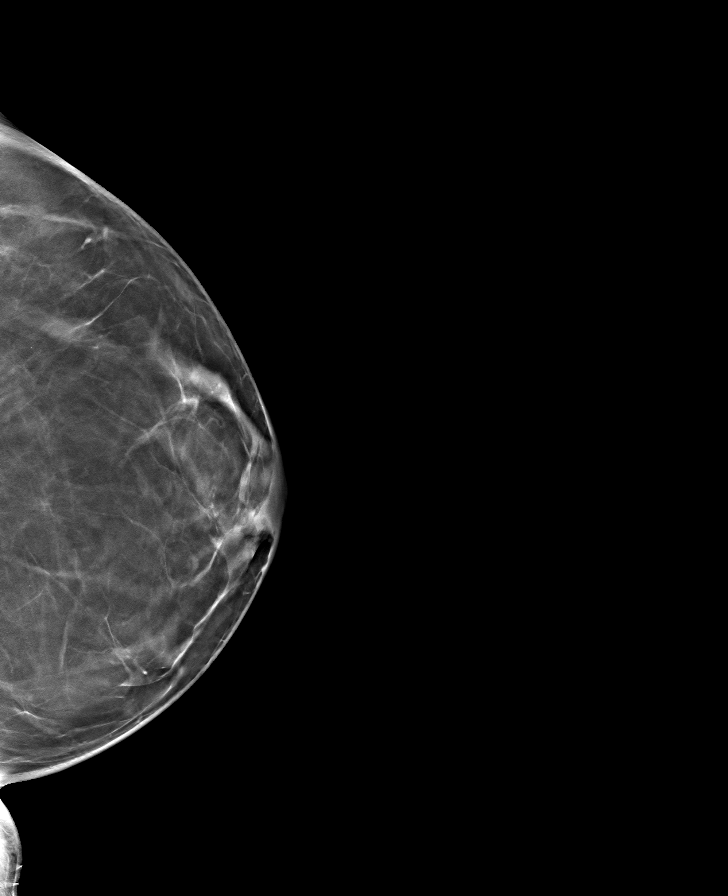

[R CC tomo · tomo slice 35/70.0]
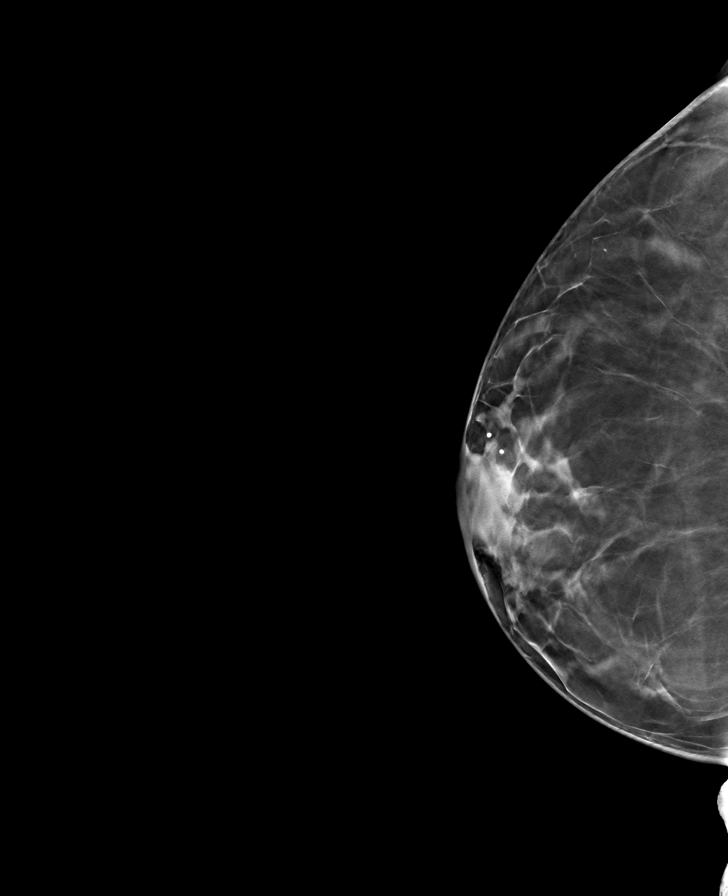

[L MLO tomo · tomo slice 37/73.0]
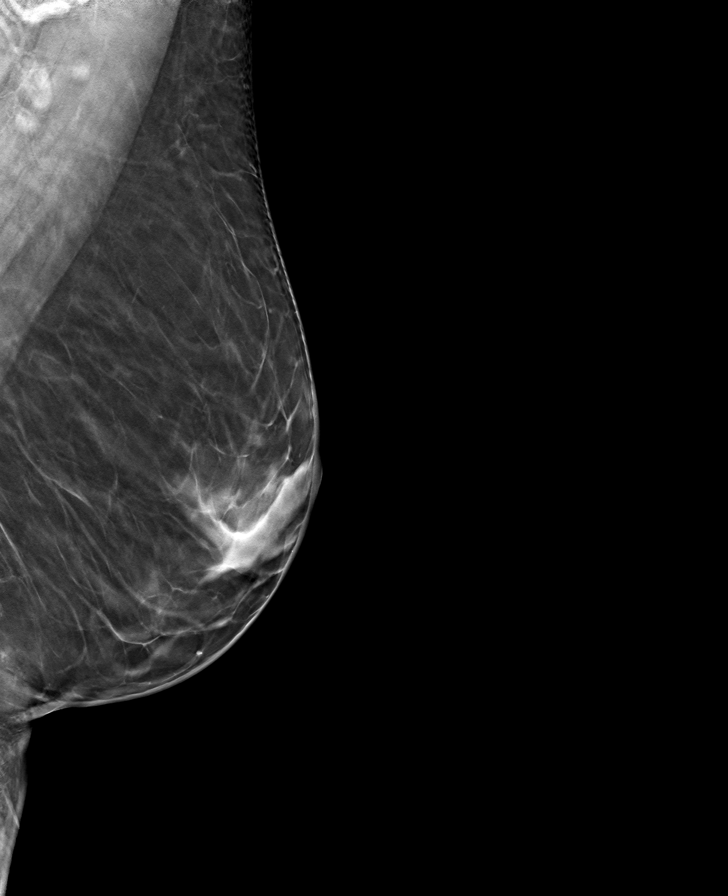

[R MLO tomo · tomo slice 36/71.0]
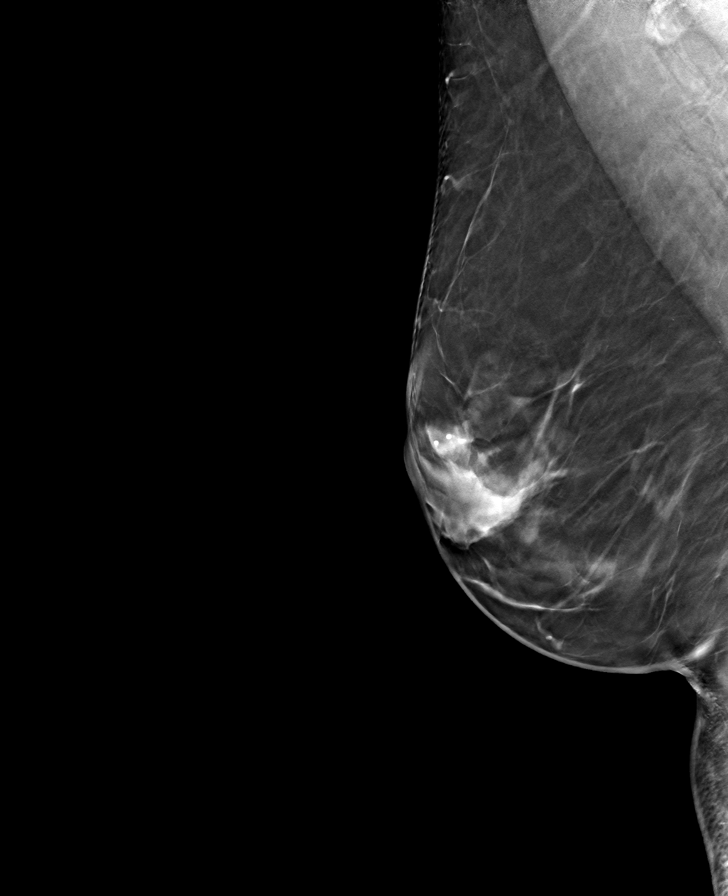

[8 of 24 positions shown; findings below may reference images not displayed]

ACR Breast Density Category b: There are scattered areas of
fibroglandular density.
FINDINGS: There are no findings suspicious for malignancy.
IMPRESSION: No mammographic evidence of malignancy. A result letter of this
screening mammogram will be mailed directly to the patient.

RECOMMENDATION:
Screening mammogram in one year. (Code:51-O-LD2)

BI-RADS CATEGORY  1: Negative.

## 2022-06-25 ENCOUNTER — Other Ambulatory Visit: Payer: Self-pay

## 2022-06-25 DIAGNOSIS — F419 Anxiety disorder, unspecified: Secondary | ICD-10-CM

## 2022-06-25 MED ORDER — ALPRAZOLAM 0.25 MG PO TABS: 0.2500 mg | ORAL_TABLET | Freq: Every evening | ORAL | 0 refills | Status: DC | PRN

## 2022-06-25 NOTE — Telephone Encounter (Signed)
Requesting refill on generic Xanax.

## 2022-08-28 ENCOUNTER — Telehealth: Payer: Self-pay

## 2022-08-28 NOTE — Telephone Encounter (Signed)
Pt reports that she hasn't had a cycle since stopping COCs after AEX in 01/2022. Pt c/o hot flashes are bothersome throughout the day along with anxiety, irritability, and sleep troubles. Pt would like to start some type of HRT that would be recommended for her. Please advise.   Scheduled for next AEX on 01/14/2023. Last mammo 02/07/2022- neg birads 1

## 2022-08-28 NOTE — Telephone Encounter (Signed)
Please have her schedule OV to discuss management options.

## 2022-08-28 NOTE — Telephone Encounter (Signed)
Pt notified and voiced understanding. Sent to appt desk to schedule.

## 2022-08-30 ENCOUNTER — Ambulatory Visit: Payer: 59 | Admitting: Nurse Practitioner

## 2022-08-30 VITALS — BP 108/72

## 2022-08-30 DIAGNOSIS — N951 Menopausal and female climacteric states: Secondary | ICD-10-CM | POA: Diagnosis not present

## 2022-08-30 DIAGNOSIS — Z79890 Hormone replacement therapy (postmenopausal): Secondary | ICD-10-CM | POA: Diagnosis not present

## 2022-08-30 MED ORDER — ESTRADIOL 0.025 MG/24HR TD PTTW: 1.0000 | MEDICATED_PATCH | TRANSDERMAL | 2 refills | Status: DC

## 2022-08-30 MED ORDER — PROGESTERONE MICRONIZED 100 MG PO CAPS
100.0000 mg | ORAL_CAPSULE | Freq: Every day | ORAL | 1 refills | Status: DC
  Filled 2022-09-06: qty 30, 30d supply, fill #0
  Filled 2022-10-05: qty 30, 30d supply, fill #1
  Filled 2022-10-31: qty 30, 30d supply, fill #2
  Filled 2022-11-30: qty 30, 30d supply, fill #3
  Filled 2023-01-04: qty 30, 30d supply, fill #4

## 2022-08-30 NOTE — Progress Notes (Signed)
   Acute Office Visit  Subjective:    Patient ID: Leslie Tate, female    DOB: [birth year], 57 y.o.   MRN: [id number]   HPI 57 y.o. presents today for menopausal symptoms. Complains of hot flashes, night sweats, irritability, brain fog and difficult sleeping. It is affecting her daily life and causing feelings of anxiousness. LMP 01/2022 after stopping COCs.   Patient's last menstrual period was 01/07/2022 (approximate).    Review of Systems  Constitutional: Negative.   Endocrine: Positive for heat intolerance.  Psychiatric/Behavioral:  Positive for agitation, decreased concentration and sleep disturbance. The patient is nervous/anxious.        Objective:    Physical Exam Constitutional:      Appearance: Normal appearance.  Psychiatric:        Attention and Perception: Attention normal.        Mood and Affect: Mood normal.        Speech: Speech normal.        Behavior: Behavior normal.     BP 108/72 (BP Location: Left Arm, Patient Position: Sitting, Cuff Size: Normal)   LMP 01/07/2022 (Approximate)  Wt Readings from Last 3 Encounters:  01/09/22 138 lb (62.6 kg)  01/05/21 140 lb (63.5 kg)  01/05/20 139 lb (63 kg)         Assessment & Plan:   Problem List Items Addressed This Visit   None Visit Diagnoses     Menopausal symptoms    -  Primary   Relevant Medications   progesterone (PROMETRIUM) 100 MG capsule   estradiol (VIVELLE-DOT) 0.025 MG/24HR   Postmenopausal hormone therapy       Relevant Medications   progesterone (PROMETRIUM) 100 MG capsule   estradiol (VIVELLE-DOT) 0.025 MG/24HR      Plan: Discussed lifestyle changes, OTC supplements, SSRIs, and HRT. No contraindications for HRT. Unknown family history due to adoption status. Benefits and risks of HRT discussed. Vivelle patch 0.025 mg twice weekly, Prometrium 100 mg nightly. Mag threonate for brain fog. UTD on mammogram.      [name] DNP, 3:26 PM 08/30/2022

## 2022-09-06 ENCOUNTER — Other Ambulatory Visit (HOSPITAL_BASED_OUTPATIENT_CLINIC_OR_DEPARTMENT_OTHER): Payer: Self-pay

## 2022-09-10 NOTE — Telephone Encounter (Signed)
Pt seen by TW on 08/30/22. Will close encounter.

## 2022-10-05 ENCOUNTER — Other Ambulatory Visit (HOSPITAL_BASED_OUTPATIENT_CLINIC_OR_DEPARTMENT_OTHER): Payer: Self-pay

## 2022-11-01 ENCOUNTER — Other Ambulatory Visit (HOSPITAL_BASED_OUTPATIENT_CLINIC_OR_DEPARTMENT_OTHER): Payer: Self-pay

## 2022-11-23 ENCOUNTER — Other Ambulatory Visit: Payer: Self-pay

## 2022-11-23 DIAGNOSIS — N951 Menopausal and female climacteric states: Secondary | ICD-10-CM

## 2022-11-23 DIAGNOSIS — Z79890 Hormone replacement therapy (postmenopausal): Secondary | ICD-10-CM

## 2022-11-23 MED ORDER — ESTRADIOL 0.025 MG/24HR TD PTTW: 1.0000 | MEDICATED_PATCH | TRANSDERMAL | 0 refills | Status: DC

## 2022-11-23 NOTE — Telephone Encounter (Signed)
Tw pt LVM in triage line stating that she is doing well on HRT but needs refill. States she will need to change pharmacy for progesterone.  Med refill request: Patches/Progesterone Last AEX: 01/09/2022 Next AEX: 01/14/2023 Last MMG (if hormonal med): 02/07/2022 Refill authorized: Rx pend.   FYI. Rxs sent on 08/30/2022: Only 3 month supply sent for patches. However, 6 month supply sent for progesterone. So, should have enough until scheduled AEX.  Patches pend.   LVMTCB.

## 2022-11-23 NOTE — Telephone Encounter (Signed)
Pt confirmed still ok with progesterone but does need the refill on [facility]   Rx pend.

## 2022-12-01 ENCOUNTER — Other Ambulatory Visit (HOSPITAL_BASED_OUTPATIENT_CLINIC_OR_DEPARTMENT_OTHER): Payer: Self-pay

## 2022-12-07 ENCOUNTER — Other Ambulatory Visit: Payer: Self-pay

## 2022-12-07 DIAGNOSIS — F419 Anxiety disorder, unspecified: Secondary | ICD-10-CM

## 2022-12-07 MED ORDER — ALPRAZOLAM 0.25 MG PO TABS: 0.2500 mg | ORAL_TABLET | Freq: Every evening | ORAL | 0 refills | Status: DC | PRN

## 2022-12-07 NOTE — Telephone Encounter (Signed)
Med refill request: Xanax Last AEX: 01/09/2022 Next AEX: 01/14/2023 Last MMG (if hormonal med): n/a Refill authorized: rx pend.

## 2022-12-25 ENCOUNTER — Other Ambulatory Visit: Payer: Self-pay | Admitting: Nurse Practitioner

## 2022-12-25 DIAGNOSIS — Z1231 Encounter for screening mammogram for malignant neoplasm of breast: Secondary | ICD-10-CM

## 2023-01-11 ENCOUNTER — Encounter: Payer: Self-pay | Admitting: Nurse Practitioner

## 2023-01-14 ENCOUNTER — Other Ambulatory Visit (HOSPITAL_BASED_OUTPATIENT_CLINIC_OR_DEPARTMENT_OTHER): Payer: Self-pay

## 2023-01-14 ENCOUNTER — Ambulatory Visit (INDEPENDENT_AMBULATORY_CARE_PROVIDER_SITE_OTHER): Payer: 59 | Admitting: Nurse Practitioner

## 2023-01-14 ENCOUNTER — Encounter: Payer: Self-pay | Admitting: Nurse Practitioner

## 2023-01-14 VITALS — BP 104/64 | HR 70 | Resp 16 | Ht 62.5 in | Wt 141.0 lb

## 2023-01-14 DIAGNOSIS — Z79890 Hormone replacement therapy (postmenopausal): Secondary | ICD-10-CM

## 2023-01-14 DIAGNOSIS — E785 Hyperlipidemia, unspecified: Secondary | ICD-10-CM

## 2023-01-14 DIAGNOSIS — Z01419 Encounter for gynecological examination (general) (routine) without abnormal findings: Secondary | ICD-10-CM

## 2023-01-14 MED ORDER — PROGESTERONE MICRONIZED 100 MG PO CAPS
100.0000 mg | ORAL_CAPSULE | Freq: Every day | ORAL | 3 refills | Status: DC
  Filled 2023-01-14: qty 90, 90d supply, fill #0
  Filled 2023-02-02: qty 30, 30d supply, fill #0
  Filled 2023-03-06: qty 30, 30d supply, fill #1
  Filled 2023-04-09: qty 30, 30d supply, fill #2
  Filled 2023-05-20: qty 30, 30d supply, fill #3
  Filled 2023-06-19: qty 30, 30d supply, fill #4
  Filled 2023-07-20 (×2): qty 30, 30d supply, fill #5
  Filled 2023-08-18: qty 30, 30d supply, fill #6
  Filled 2023-09-23: qty 30, 30d supply, fill #7
  Filled 2023-10-28: qty 30, 30d supply, fill #8
  Filled 2023-11-24: qty 30, 30d supply, fill #9
  Filled 2023-12-26: qty 30, 30d supply, fill #10

## 2023-01-14 MED ORDER — ESTRADIOL 0.025 MG/24HR TD PTTW
1.0000 | MEDICATED_PATCH | TRANSDERMAL | 3 refills | Status: DC
  Filled 2023-01-14: qty 24, 84d supply, fill #0

## 2023-01-14 MED ORDER — ESTRADIOL 0.025 MG/24HR TD PTTW: 1.0000 | MEDICATED_PATCH | TRANSDERMAL | 3 refills | Status: DC

## 2023-01-14 MED ORDER — ESTRADIOL 0.025 MG/24HR TD PTTW: 1.0000 | MEDICATED_PATCH | TRANSDERMAL | 0 refills | Status: DC

## 2023-01-14 NOTE — Progress Notes (Signed)
Leslie Tate 03-17-1966 [id number]   History:  57 y.o. G0 presents for annual exam. Started HRT in May and doing well. No bleeding. Normal pap and mammogram history. Takes Xanax rarely for sleep and anxiety.  Gynecologic History No LMP recorded. Patient is perimenopausal.   Contraception: post menopausal status Sexually active: Yes  Health maintenance Last Pap: 01/05/2021. Results were: Normal, 3-year repeat Last mammogram: 02/07/2022. Results were: Normal, scheduled 11/4 Last colonoscopy: 03/18/2018. Results were: SSP, 5-year recall Last Dexa: Not indicated  Past medical history, past surgical history, family history and social history were all reviewed and documented in the EPIC chart. Married. Started new job this summer. Husband retired from [employer].  ROS:  A ROS was performed and pertinent positives and negatives are included.  Exam:  Vitals:   01/14/23 0759  BP: 104/64  Pulse: 70  Resp: 16  Weight: 141 lb (64 kg)  Height: 5' 2.5" (1.588 m)      Body mass index is 25.38 kg/m.  General appearance:  Normal Thyroid:  Symmetrical, normal in size, without palpable masses or nodularity. Respiratory  Auscultation:  Clear without wheezing or rhonchi Cardiovascular  Auscultation:  Regular rate, without rubs, murmurs or gallops  Edema/varicosities:  Not grossly evident Abdominal  Soft,nontender, without masses, guarding or rebound.  Liver/spleen:  No organomegaly noted  Hernia:  None appreciated  Skin  Inspection:  Grossly normal   Breasts: Examined lying and sitting.  Right: Without masses, retractions, discharge or axillary adenopathy.   Left: Without masses, retractions, discharge or axillary adenopathy. Pelvic: External genitalia:  no lesions              Urethra:  normal appearing urethra with no masses, tenderness or lesions              Bartholins and Skenes: normal                 Vagina: normal appearing vagina with normal color and discharge, no  lesions              Cervix: no lesions Bimanual Exam:  Uterus:  no masses or tenderness              Adnexa: no mass, fullness, tenderness              Rectovaginal: Deferred              Anus:  normal, no lesions  Patient informed chaperone available to be present for breast and pelvic exam. Patient has requested no chaperone to be present. Patient has been advised what will be completed during breast and pelvic exam.   Assessment/Plan:  57 y.o. G0 for annual exam.   Well female exam with routine gynecological exam - Plan: CBC with Differential/Platelet, Comprehensive metabolic panel. Education provided on SBEs, importance of preventative screenings, current guidelines, high calcium diet, regular exercise, and multivitamin daily.   Hyperlipidemia, unspecified hyperlipidemia type - Plan: Lipid panel  Well female exam with routine gynecological exam - Plan: CBC with Differential/Platelet, Comprehensive metabolic panel  Hyperlipidemia, unspecified hyperlipidemia type - Plan: Lipid panel  Postmenopausal hormone therapy - Plan: progesterone (PROMETRIUM) 100 MG capsule nightly, estradiol (VIVELLE-DOT) 0.025 MG/24 HR twice weekly. Much improvement in menopausal symptoms. Aware of risk and benefits of use. Refill x 1 year provided.   Screening for cervical cancer - Normal Pap history. Will repeat at 3-year interval per guidelines.   Screening for breast cancer - Normal mammogram history.  Continue annual screenings. Scheduled 11/4.  Normal breast exam today.  Screening for colon cancer - 2019 colonoscopy. Will repeat at GI's recommended interval.   Screening for osteoporosis - Unsure of family history, adopted. Very active with Leslie Tate 4 days per week.   Follow up in 1 year for annual or sooner if needed.       Leslie Tate [facility], 8:25 AM 01/14/2023

## 2023-01-15 LAB — COMPREHENSIVE METABOLIC PANEL
AG Ratio: 2 (calc) (ref 1.0–2.5)
ALT: 20 U/L (ref 6–29)
AST: 18 U/L (ref 10–35)
Albumin: 4.3 g/dL (ref 3.6–5.1)
Alkaline phosphatase (APISO): 61 U/L (ref 37–153)
BUN: 14 mg/dL (ref 7–25)
CO2: 26 mmol/L (ref 20–32)
Calcium: 9.6 mg/dL (ref 8.6–10.4)
Chloride: 105 mmol/L (ref 98–110)
Creat: 0.75 mg/dL (ref 0.50–1.03)
Globulin: 2.1 g/dL (ref 1.9–3.7)
Glucose, Bld: 95 mg/dL (ref 65–99)
Potassium: 4.4 mmol/L (ref 3.5–5.3)
Sodium: 140 mmol/L (ref 135–146)
Total Bilirubin: 1.1 mg/dL (ref 0.2–1.2)
Total Protein: 6.4 g/dL (ref 6.1–8.1)

## 2023-01-15 LAB — CBC WITH DIFFERENTIAL/PLATELET
Absolute Monocytes: 473 {cells}/uL (ref 200–950)
Basophils Absolute: 77 {cells}/uL (ref 0–200)
Basophils Relative: 0.7 %
Eosinophils Absolute: 110 {cells}/uL (ref 15–500)
Eosinophils Relative: 1 %
HCT: 41.3 % (ref 35.0–45.0)
Hemoglobin: 13.8 g/dL (ref 11.7–15.5)
Lymphs Abs: 1705 {cells}/uL (ref 850–3900)
MCH: 31.9 pg (ref 27.0–33.0)
MCHC: 33.4 g/dL (ref 32.0–36.0)
MCV: 95.4 fL (ref 80.0–100.0)
MPV: 10.7 fL (ref 7.5–12.5)
Monocytes Relative: 4.3 %
Neutro Abs: 8635 {cells}/uL — ABNORMAL HIGH (ref 1500–7800)
Neutrophils Relative %: 78.5 %
Platelets: 257 10*3/uL (ref 140–400)
RBC: 4.33 10*6/uL (ref 3.80–5.10)
RDW: 11.8 % (ref 11.0–15.0)
Total Lymphocyte: 15.5 %
WBC: 11 10*3/uL — ABNORMAL HIGH (ref 3.8–10.8)

## 2023-01-15 LAB — LIPID PANEL
Cholesterol: 209 mg/dL — ABNORMAL HIGH (ref <200)
HDL: 109 mg/dL (ref >=50)
LDL Cholesterol (Calc): 84 mg/dL
Non-HDL Cholesterol (Calc): 100 mg/dL (ref <130)
Total CHOL/HDL Ratio: 1.9 (calc) (ref <5.0)
Triglycerides: 72 mg/dL (ref <150)

## 2023-01-17 ENCOUNTER — Encounter: Payer: Self-pay | Admitting: Gastroenterology

## 2023-02-04 ENCOUNTER — Other Ambulatory Visit (HOSPITAL_BASED_OUTPATIENT_CLINIC_OR_DEPARTMENT_OTHER): Payer: Self-pay

## 2023-02-11 ENCOUNTER — Ambulatory Visit
Admission: RE | Admit: 2023-02-11 | Discharge: 2023-02-11 | Disposition: A | Discharge status: Home / Self Care | Payer: Self-pay | Source: Ambulatory Visit | Attending: Nurse Practitioner | Admitting: Nurse Practitioner

## 2023-02-11 DIAGNOSIS — Z1231 Encounter for screening mammogram for malignant neoplasm of breast: Secondary | ICD-10-CM

## 2023-02-19 ENCOUNTER — Ambulatory Visit (AMBULATORY_SURGERY_CENTER): Payer: 59

## 2023-02-19 VITALS — Ht 62.5 in | Wt 140.0 lb

## 2023-02-19 DIAGNOSIS — Z860100 Personal history of colon polyps, unspecified: Secondary | ICD-10-CM

## 2023-02-19 NOTE — Progress Notes (Signed)

## 2023-03-06 ENCOUNTER — Other Ambulatory Visit (HOSPITAL_BASED_OUTPATIENT_CLINIC_OR_DEPARTMENT_OTHER): Payer: Self-pay

## 2023-03-21 ENCOUNTER — Ambulatory Visit: Payer: Self-pay | Admitting: Gastroenterology

## 2023-03-26 ENCOUNTER — Telehealth: Payer: Self-pay | Admitting: Gastroenterology

## 2023-03-26 DIAGNOSIS — Z860100 Personal history of colon polyps, unspecified: Secondary | ICD-10-CM

## 2023-03-26 MED ORDER — NA SULFATE-K SULFATE-MG SULF 17.5-3.13-1.6 GM/177ML PO SOLN: 1.0000 | Freq: Once | ORAL | 0 refills | Status: AC

## 2023-03-26 NOTE — Telephone Encounter (Signed)
Informed pt that medication has been sent to [facility] on [address] as requested. Pt states she has the texted coupon for med

## 2023-03-26 NOTE — Telephone Encounter (Signed)
Inbound call from patient for prep medication for 12/23 colonoscopy to be sent to [facility] in [place]. Please advise, thank you.

## 2023-03-27 ENCOUNTER — Telehealth: Payer: Self-pay | Admitting: *Deleted

## 2023-03-27 MED ORDER — NA SULFATE-K SULFATE-MG SULF 17.5-3.13-1.6 GM/177ML PO SOLN: 1.0000 | Freq: Once | ORAL | 0 refills | Status: AC

## 2023-03-27 NOTE — Telephone Encounter (Signed)
Call to pharmacy  [facility] on [address] to see if med. Tech stated it can take up to 2 hours to process since she is a new pt but they will contact her once it is ready.

## 2023-03-27 NOTE — Telephone Encounter (Signed)
Pt notifeid the RN has replaced RX to [facility] on [address] as well as what [employer] said about how long it may take to process this order since she is a new pt and that they will call her when it it ready. Pt states she understood and had no other questions at this time.

## 2023-03-27 NOTE — Telephone Encounter (Addendum)
Patient called and stated that her su-prep was not sent over to the right location. Patient also stated that [facility] went to the [facility] on [address] and they stated to her they did not have anything for her. Patient as well stated that [facility] [address], but she did not want it to go there due to her having a coupon for the su-prep at [facility] [address]. Please advise.

## 2023-04-01 ENCOUNTER — Ambulatory Visit: Payer: 59 | Admitting: Gastroenterology

## 2023-04-01 ENCOUNTER — Encounter: Payer: Self-pay | Admitting: Gastroenterology

## 2023-04-01 VITALS — BP 120/81 | HR 61 | Temp 98.4°F | Resp 12 | Ht 62.5 in | Wt 140.0 lb

## 2023-04-01 DIAGNOSIS — K635 Polyp of colon: Secondary | ICD-10-CM | POA: Diagnosis not present

## 2023-04-01 DIAGNOSIS — Z860100 Personal history of colon polyps, unspecified: Secondary | ICD-10-CM

## 2023-04-01 DIAGNOSIS — Z1211 Encounter for screening for malignant neoplasm of colon: Secondary | ICD-10-CM | POA: Diagnosis present

## 2023-04-01 DIAGNOSIS — K5730 Diverticulosis of large intestine without perforation or abscess without bleeding: Secondary | ICD-10-CM

## 2023-04-01 MED ORDER — SODIUM CHLORIDE 0.9 % IV SOLN: 500.0000 mL | Freq: Once | INTRAVENOUS | Status: DC

## 2023-04-01 NOTE — Patient Instructions (Signed)
Resume previous diet and medications.  Follow up colonoscopy will be based on pathology results.  Handout provided on polyps.    YOU HAD AN ENDOSCOPIC PROCEDURE TODAY AT THE [facility]:   Refer to the procedure report that was given to you for any specific questions about what was found during the examination.  If the procedure report does not answer your questions, please call your gastroenterologist to clarify.  If you requested that your care partner not be given the details of your procedure findings, then the procedure report has been included in a sealed envelope for you to review at your convenience later.  YOU SHOULD EXPECT: Some feelings of bloating in the abdomen. Passage of more gas than usual.  Walking can help get rid of the air that was put into your GI tract during the procedure and reduce the bloating. If you had a lower endoscopy (such as a colonoscopy or flexible sigmoidoscopy) you may notice spotting of blood in your stool or on the toilet paper. If you underwent a bowel prep for your procedure, you may not have a normal bowel movement for a few days.  Please Note:  You might notice some irritation and congestion in your nose or some drainage.  This is from the oxygen used during your procedure.  There is no need for concern and it should clear up in a day or so.  SYMPTOMS TO REPORT IMMEDIATELY:  Following lower endoscopy (colonoscopy or flexible sigmoidoscopy):  Excessive amounts of blood in the stool  Significant tenderness or worsening of abdominal pains  Swelling of the abdomen that is new, acute  Fever of 100F or higher  For urgent or emergent issues, a gastroenterologist can be reached at any hour by calling [phone]. Do not use MyChart messaging for urgent concerns.    DIET:  We do recommend a small meal at first, but then you may proceed to your regular diet.  Drink plenty of fluids but you should avoid alcoholic beverages for 24 hours.  ACTIVITY:   You should plan to take it easy for the rest of today and you should NOT DRIVE or use heavy machinery until tomorrow (because of the sedation medicines used during the test).    FOLLOW UP: Our staff will call the number listed on your records the next business day following your procedure.  We will call around 7:15- 8:00 am to check on you and address any questions or concerns that you may have regarding the information given to you following your procedure. If we do not reach you, we will leave a message.     If any biopsies were taken you will be contacted by phone or by letter within the next 1-3 weeks.  Please call us at [phone] if you have not heard about the biopsies in 3 weeks.    SIGNATURES/CONFIDENTIALITY: You and/or your care partner have signed paperwork which will be entered into your electronic medical record.  These signatures attest to the fact that that the information above on your After Visit Summary has been reviewed and is understood.  Full responsibility of the confidentiality of this discharge information lies with you and/or your care-partner.

## 2023-04-01 NOTE — Progress Notes (Signed)
Sedate, gd SR, tolerated procedure well, VSS, report to RN 

## 2023-04-01 NOTE — Progress Notes (Signed)
GASTROENTEROLOGY PROCEDURE H&P NOTE   Primary Care Physician: Pcp, No    Reason for Procedure:  Colon polyp surveillance  Plan:    Colonoscopy  Patient is appropriate for endoscopic procedure(s) in the ambulatory ([facility]) setting.  The nature of the procedure, as well as the risks, benefits, and alternatives were carefully and thoroughly reviewed with the patient. Ample time for discussion and questions allowed. The patient understood, was satisfied, and agreed to proceed.     HPI: Leslie Tate is a 57 y.o. female who presents for colonoscopy for ongoing colon polyp surveillance and colon cancer screening.  No active GI symptoms.  No known family history of colon cancer or related malignancy.  Patient is otherwise without complaints or active issues today.  Last colonoscopy was 03/2018 and notable for 5 mm cecal SSP, 3 mm ascending colon SSP, and benign 2 mm hyperplastic polyp, with recommendation to repeat in 5 years.  Past Medical History:  Diagnosis Date   Anxiety    Eczema    Post-operative nausea and vomiting     Past Surgical History:  Procedure Laterality Date   BREAST SURGERY  1990   REDUCTION   REDUCTION MAMMAPLASTY Bilateral     Prior to Admission medications   Medication Sig Start Date End Date Taking? Authorizing Provider  ALPRAZolam (XANAX) 0.25 MG tablet Take 1 tablet (0.25 mg total) by mouth at bedtime as needed for anxiety. 12/07/22  Yes Chrzanowski, Jami B, NP  estradiol (VIVELLE-DOT) 0.025 MG/24HR Place 1 patch onto the skin 2 (two) times a week. 01/14/23  Yes Wyline Beady A, NP  Multiple Vitamin (MULTIVITAMIN PO) Take by mouth.   Yes [provider]  progesterone (PROMETRIUM) 100 MG capsule Take 1 capsule (100 mg total) by mouth daily. 01/14/23  Yes [name], NP  OVER THE COUNTER MEDICATION CBD gummies    [provider]    Current Outpatient Medications  Medication Sig Dispense Refill   ALPRAZolam (XANAX)  0.25 MG tablet Take 1 tablet (0.25 mg total) by mouth at bedtime as needed for anxiety. 30 tablet 0   estradiol (VIVELLE-DOT) 0.025 MG/24HR Place 1 patch onto the skin 2 (two) times a week. 24 patch 3   Multiple Vitamin (MULTIVITAMIN PO) Take by mouth.     progesterone (PROMETRIUM) 100 MG capsule Take 1 capsule (100 mg total) by mouth daily. 90 capsule 3   OVER THE COUNTER MEDICATION CBD gummies     Current Facility-Administered Medications  Medication Dose Route Frequency Provider Last Rate Last Admin   0.9 %  sodium chloride infusion  500 mL Intravenous Once Griselda Bramblett V, DO        Allergies as of 04/01/2023   (No Known Allergies)    Family History  Adopted: Yes  Family history unknown: Yes    Social History   Socioeconomic History   Marital status: Married    Spouse name: Not on file   Number of children: Not on file   Years of education: Not on file   Highest education level: Not on file  Occupational History   Not on file  Tobacco Use   Smoking status: Never   Smokeless tobacco: Never  Vaping Use   Vaping status: Never Used  Substance and Sexual Activity   Alcohol use: Yes    Alcohol/week: 5.0 standard drinks of alcohol    Types: 5 Standard drinks or equivalent per week   Drug use: Not Currently    Comment: THC gummies  Sexual activity: Yes    Partners: Male    Birth control/protection: None  Other Topics Concern   Not on file  Social History Narrative   Not on file   Social Drivers of Health   Financial Resource Strain: Not on file  Food Insecurity: Not on file  Transportation Needs: Not on file  Physical Activity: Not on file  Stress: Not on file  Social Connections: Not on file  Intimate Partner Violence: Not on file    Physical Exam: Vital signs in last 24 hours: @BP  117/72   Pulse 69   Temp 98.4 F (36.9 C) (Temporal)   Ht 5' 2.5" (1.588 m)   Wt 140 lb (63.5 kg)   SpO2 97%   BMI 25.20 kg/m  GEN: NAD EYE: Sclerae anicteric ENT:  MMM CV: Non-tachycardic Pulm: CTA b/l GI: Soft, NT/ND NEURO:  Alert & Oriented x 3   Doristine Locks, DO [facility]   04/01/2023 1:10 PM

## 2023-04-01 NOTE — Progress Notes (Signed)
Pt's states no medical or surgical changes since previsit or office visit. 

## 2023-04-01 NOTE — Op Note (Signed)
[facility] Patient Name: Leslie Tate Procedure Date: 04/01/2023 1:08 PM MRN: [id number] Endoscopist: Doristine Locks , MD, [id number] Age: 57 Referring MD:  Date of Birth: 30-Oct-1965 Gender: Female Account #: [id number] Procedure:                Colonoscopy Indications:              High risk colon cancer surveillance: Personal                            history of sessile serrated colon polyp (less than                            10 mm in size) with no dysplasia                           Last colonoscopy was 03/2018 and notable for 5 mm                            cecal SSP, 3 mm ascending colon SSP, and benign 2                            mm hyperplastic polyp, with recommendation to                            repeat in 5 years. Medicines:                Monitored Anesthesia Care Procedure:                Pre-Anesthesia Assessment:                           - Prior to the procedure, a History and Physical                            was performed, and patient medications and                            allergies were reviewed. The patient's tolerance of                            previous anesthesia was also reviewed. The risks                            and benefits of the procedure and the sedation                            options and risks were discussed with the patient.                            All questions were answered, and informed consent                            was obtained. Prior Anticoagulants: The patient has  taken no anticoagulant or antiplatelet agents. ASA                            Grade Assessment: II - A patient with mild systemic                            disease. After reviewing the risks and benefits,                            the patient was deemed in satisfactory condition to                            undergo the procedure.                           After obtaining informed consent, the colonoscope                             was passed under direct vision. Throughout the                            procedure, the patient's blood pressure, pulse, and                            oxygen saturations were monitored continuously. The                            Olympus Scope SN [id number] was introduced through the                            anus and advanced to the the terminal ileum. The                            colonoscopy was performed without difficulty. The                            patient tolerated the procedure well. The quality                            of the bowel preparation was good. The terminal                            ileum, ileocecal valve, appendiceal orifice, and                            rectum were photographed. Scope In: 1:19:12 PM Scope Out: 1:35:00 PM Scope Withdrawal Time: 0 hours 14 minutes 1 second  Total Procedure Duration: 0 hours 15 minutes 48 seconds  Findings:                 The perianal and digital rectal examinations were                            normal.  A 3 mm polyp was found in the sigmoid colon. The                            polyp was sessile. The polyp was removed with a                            cold snare. Resection and retrieval were complete.                            Estimated blood loss was minimal.                           A few small-mouthed diverticula were found in the                            sigmoid colon.                           The exam was otherwise normal throughout the                            remainder of the colon.                           The retroflexed view of the distal rectum and anal                            verge was normal and showed no anal or rectal                            abnormalities.                           The terminal ileum appeared normal. Complications:            No immediate complications. Estimated Blood Loss:     Estimated blood loss was minimal. Impression:                - One 3 mm polyp in the sigmoid colon, removed with                            a cold snare. Resected and retrieved.                           - Diverticulosis in the sigmoid colon.                           - The distal rectum and anal verge are normal on                            retroflexion view.                           - The examined portion of the ileum was normal. Recommendation:           -  Patient has a contact number available for                            emergencies. The signs and symptoms of potential                            delayed complications were discussed with the                            patient. Return to normal activities tomorrow.                            Written discharge instructions were provided to the                            patient.                           - Resume previous diet.                           - Continue present medications.                           - Await pathology results.                           - Repeat colonoscopy for surveillance based on                            pathology results.                           - Return to GI office PRN. Doristine Locks, MD 04/01/2023 1:40:28 PM

## 2023-04-02 ENCOUNTER — Telehealth: Payer: Self-pay | Admitting: *Deleted

## 2023-04-02 NOTE — Progress Notes (Signed)
Called to room to assist during endoscopic procedure.  Patient ID and intended procedure confirmed with present staff. Received instructions for my participation in the procedure from the performing physician.  

## 2023-04-02 NOTE — Addendum Note (Signed)
Addended by: Waynard Edwards T on: 04/02/2023 09:05 AM   Modules accepted: Orders

## 2023-04-02 NOTE — Telephone Encounter (Signed)
  Follow up Call-     04/01/2023   12:50 PM  Call back number  Post procedure Call Back phone  # [phone]  Permission to leave phone message Yes     Patient questions:  Do you have a fever, pain , or abdominal swelling? No. Pain Score  0 *  Have you tolerated food without any problems? Yes.    Have you been able to return to your normal activities? Yes.    Do you have any questions about your discharge instructions: Diet   No. Medications  No. Follow up visit  No.  Do you have questions or concerns about your Care? No.  Actions: * If pain score is 4 or above: No action needed, pain <4.

## 2023-04-08 LAB — SURGICAL PATHOLOGY

## 2023-05-07 ENCOUNTER — Other Ambulatory Visit: Payer: Self-pay

## 2023-05-07 DIAGNOSIS — F419 Anxiety disorder, unspecified: Secondary | ICD-10-CM

## 2023-05-07 MED ORDER — ALPRAZOLAM 0.25 MG PO TABS: 0.2500 mg | ORAL_TABLET | Freq: Every evening | ORAL | 0 refills | Status: DC | PRN

## 2023-05-07 NOTE — Telephone Encounter (Signed)
Medication refill request: xanax 0.25mg  Last AEX:  01-14-23 Next AEX: 01-16-24 Last MMG (if hormonal medication request): n/a Refill authorized: please approve if appropriate

## 2023-07-20 ENCOUNTER — Other Ambulatory Visit (HOSPITAL_BASED_OUTPATIENT_CLINIC_OR_DEPARTMENT_OTHER): Payer: Self-pay

## 2023-08-05 ENCOUNTER — Telehealth: Payer: Self-pay

## 2023-08-05 ENCOUNTER — Other Ambulatory Visit: Payer: Self-pay

## 2023-08-05 DIAGNOSIS — F419 Anxiety disorder, unspecified: Secondary | ICD-10-CM

## 2023-08-05 MED ORDER — ALPRAZOLAM 0.25 MG PO TABS: 0.2500 mg | ORAL_TABLET | Freq: Every evening | ORAL | 0 refills | Status: DC | PRN

## 2023-08-05 NOTE — Telephone Encounter (Signed)
 Medication refill request: alprazolam   Last AEX:  01/14/23 Next AEX: 01/16/24 Last MMG (if hormonal medication request): n/a Refill authorized: please advise

## 2023-08-05 NOTE — Telephone Encounter (Signed)
 Pt LVM in med refill line requesting refills on HRT patches.   Rx sent for 1 yr on 01/14/2023 to [facility] [address].   LDVM on machine per DPR advising the pt that she should have refills on file at the [facility]. Advised to cb if has any additional questions/concerns or requires further assistance.   Encounter closed.

## 2023-08-30 ENCOUNTER — Other Ambulatory Visit: Payer: Self-pay

## 2023-08-30 DIAGNOSIS — F419 Anxiety disorder, unspecified: Secondary | ICD-10-CM

## 2023-08-30 NOTE — Telephone Encounter (Signed)
 Med refill request: Xanax   Last AEX: 01/14/23 Next AEX: 01/16/24 Last MMG (if hormonal med) 02/11/23 birads cat 1 neg  Refill authorized: last rx 08/05/23 #30 with 0 refills. Pat ient left VM stating she is going out of town for 10+ days and is requesting a refill. Please approve or deny

## 2023-09-03 MED ORDER — ALPRAZOLAM 0.25 MG PO TABS: 0.2500 mg | ORAL_TABLET | Freq: Every evening | ORAL | 0 refills | Status: DC | PRN

## 2023-11-13 ENCOUNTER — Other Ambulatory Visit: Payer: Self-pay

## 2023-11-13 DIAGNOSIS — F419 Anxiety disorder, unspecified: Secondary | ICD-10-CM

## 2023-11-13 MED ORDER — ALPRAZOLAM 0.25 MG PO TABS: 0.2500 mg | ORAL_TABLET | Freq: Every evening | ORAL | 0 refills | Status: DC | PRN

## 2023-11-13 NOTE — Telephone Encounter (Signed)
 Med refill request: alprazolam  0.25 mg Last AEX: 01/14/23 Next AEX: 01/16/24 Last MMG (if hormonal med) n/a Refill authorized: Please Advise?

## 2023-12-26 ENCOUNTER — Other Ambulatory Visit: Payer: Self-pay | Admitting: Nurse Practitioner

## 2023-12-26 DIAGNOSIS — Z79890 Hormone replacement therapy (postmenopausal): Secondary | ICD-10-CM

## 2023-12-26 NOTE — Telephone Encounter (Signed)
 Med refill request: estradiol  (vivelle -dot) 0.025 mg/24 hr patch Last AEX: 01/14/23 TW Next AEX: 01/16/24 TW Last MMG (if hormonal med) 02/11/23 Refill authorized: Last Rx sent #24 (84 day supply) with 3 refills on 01/14/23 TW. Please approve or deny.

## 2024-01-06 ENCOUNTER — Other Ambulatory Visit: Payer: Self-pay | Admitting: Nurse Practitioner

## 2024-01-06 DIAGNOSIS — Z0000 Encounter for general adult medical examination without abnormal findings: Secondary | ICD-10-CM

## 2024-01-15 NOTE — Progress Notes (Unsigned)
 Leslie Tate 56-Sep-1967 [id number]   History:  58 y.o. G0 presents for annual exam. Postmenopausal - on HRT. Normal pap history. Takes Xanax  rarely for sleep and anxiety.  Gynecologic History No LMP recorded. Patient is perimenopausal.   Contraception: post menopausal status Sexually active: Yes  Health maintenance Last Pap: 01/05/2021. Results were: Normal Last mammogram: 02/11/2023. Results were: Normal Last colonoscopy: 04/01/2023. Results were: benign polyp,  Last Dexa: Not indicated      No data to display           Past medical history, past surgical history, family history and social history were all reviewed and documented in the EPIC chart. Married. Started new job this summer. Husband retired from [employer].  ROS:  A ROS was performed and pertinent positives and negatives are included.  Exam:  There were no vitals filed for this visit.     There is no height or weight on file to calculate BMI.  General appearance:  Normal Thyroid:  Symmetrical, normal in size, without palpable masses or nodularity. Respiratory  Auscultation:  Clear without wheezing or rhonchi Cardiovascular  Auscultation:  Regular rate, without rubs, murmurs or gallops  Edema/varicosities:  Not grossly evident Abdominal  Soft,nontender, without masses, guarding or rebound.  Liver/spleen:  No organomegaly noted  Hernia:  None appreciated  Skin  Inspection:  Grossly normal   Breasts: Examined lying and sitting.  Right: Without masses, retractions, discharge or axillary adenopathy.   Left: Without masses, retractions, discharge or axillary adenopathy. Pelvic: External genitalia:  no lesions              Urethra:  normal appearing urethra with no masses, tenderness or lesions              Bartholins and Skenes: normal                 Vagina: normal appearing vagina with normal color and discharge, no lesions              Cervix: no lesions Bimanual Exam:  Uterus:  no masses or  tenderness              Adnexa: no mass, fullness, tenderness              Rectovaginal: Deferred              Anus:  normal, no lesions  Patient informed chaperone available to be present for breast and pelvic exam. Patient has requested no chaperone to be present. Patient has been advised what will be completed during breast and pelvic exam.   Assessment/Plan:  58 y.o. G0 for annual exam.   Well female exam with routine gynecological exam - Plan: CBC with Differential/Platelet, Comprehensive metabolic panel. Education provided on SBEs, importance of preventative screenings, current guidelines, high calcium diet, regular exercise, and multivitamin daily.   Hyperlipidemia, unspecified hyperlipidemia type - Plan: Lipid panel  Postmenopausal hormone therapy - Plan: progesterone  (PROMETRIUM ) 100 MG capsule nightly, estradiol  (VIVELLE -DOT) 0.025 MG/24 HR twice weekly. Much improvement in menopausal symptoms. Aware of risk and benefits of use. Refill x 1 year provided.   Screening for cervical cancer - Normal Pap history. Pap today per guidelines.    Screening for breast cancer - Normal mammogram history.  Continue annual screenings. Normal breast exam today.  Screening for colon cancer - 03/2023 colonoscopy. Will repeat at GI's recommended interval.   Screening for osteoporosis - Unsure of family history, adopted. Very active with [employer] 4 days  per week.   No follow-ups on file.       Leslie Tate [facility], 4:09 PM 01/15/2024

## 2024-01-16 ENCOUNTER — Encounter: Payer: Self-pay | Admitting: Nurse Practitioner

## 2024-01-16 ENCOUNTER — Other Ambulatory Visit (HOSPITAL_COMMUNITY)
Admission: RE | Admit: 2024-01-16 | Discharge: 2024-01-16 | Disposition: A | Discharge status: Home / Self Care | Source: Ambulatory Visit | Attending: Nurse Practitioner | Admitting: Nurse Practitioner

## 2024-01-16 ENCOUNTER — Ambulatory Visit (INDEPENDENT_AMBULATORY_CARE_PROVIDER_SITE_OTHER): Payer: 59 | Admitting: Nurse Practitioner

## 2024-01-16 VITALS — BP 104/64 | HR 65 | Ht 62.25 in | Wt 141.0 lb

## 2024-01-16 DIAGNOSIS — Z124 Encounter for screening for malignant neoplasm of cervix: Secondary | ICD-10-CM | POA: Diagnosis present

## 2024-01-16 DIAGNOSIS — E785 Hyperlipidemia, unspecified: Secondary | ICD-10-CM | POA: Diagnosis not present

## 2024-01-16 DIAGNOSIS — Z01419 Encounter for gynecological examination (general) (routine) without abnormal findings: Secondary | ICD-10-CM | POA: Insufficient documentation

## 2024-01-16 DIAGNOSIS — Z1331 Encounter for screening for depression: Secondary | ICD-10-CM | POA: Diagnosis not present

## 2024-01-16 DIAGNOSIS — F419 Anxiety disorder, unspecified: Secondary | ICD-10-CM

## 2024-01-16 DIAGNOSIS — Z79890 Hormone replacement therapy (postmenopausal): Secondary | ICD-10-CM | POA: Diagnosis not present

## 2024-01-16 MED ORDER — PROGESTERONE MICRONIZED 100 MG PO CAPS
100.0000 mg | ORAL_CAPSULE | Freq: Every day | ORAL | 3 refills | Status: AC
Start: 2024-01-16 — End: ?

## 2024-01-16 MED ORDER — ESTRADIOL 0.025 MG/24HR TD PTTW
1.0000 | MEDICATED_PATCH | TRANSDERMAL | 3 refills | Status: AC
Start: 2024-01-16 — End: ?

## 2024-01-16 MED ORDER — ALPRAZOLAM 0.25 MG PO TABS: 0.2500 mg | ORAL_TABLET | Freq: Every evening | ORAL | 0 refills | Status: DC | PRN

## 2024-01-17 LAB — CBC WITH DIFFERENTIAL/PLATELET
Absolute Lymphocytes: 2506 {cells}/uL (ref 850–3900)
Absolute Monocytes: 490 {cells}/uL (ref 200–950)
Basophils Absolute: 72 {cells}/uL (ref 0–200)
Basophils Relative: 1 %
Eosinophils Absolute: 209 {cells}/uL (ref 15–500)
Eosinophils Relative: 2.9 %
HCT: 43.4 % (ref 35.0–45.0)
Hemoglobin: 14.5 g/dL (ref 11.7–15.5)
MCH: 32 pg (ref 27.0–33.0)
MCHC: 33.4 g/dL (ref 32.0–36.0)
MCV: 95.8 fL (ref 80.0–100.0)
MPV: 10.1 fL (ref 7.5–12.5)
Monocytes Relative: 6.8 %
Neutro Abs: 3924 {cells}/uL (ref 1500–7800)
Neutrophils Relative %: 54.5 %
Platelets: 273 Thousand/uL (ref 140–400)
RBC: 4.53 Million/uL (ref 3.80–5.10)
RDW: 11.8 % (ref 11.0–15.0)
Total Lymphocyte: 34.8 %
WBC: 7.2 Thousand/uL (ref 3.8–10.8)

## 2024-01-17 LAB — COMPREHENSIVE METABOLIC PANEL WITH GFR
AG Ratio: 2.1 (calc) (ref 1.0–2.5)
ALT: 22 U/L (ref 6–29)
AST: 21 U/L (ref 10–35)
Albumin: 4.6 g/dL (ref 3.6–5.1)
Alkaline phosphatase (APISO): 57 U/L (ref 37–153)
BUN: 16 mg/dL (ref 7–25)
CO2: 29 mmol/L (ref 20–32)
Calcium: 9.4 mg/dL (ref 8.6–10.4)
Chloride: 105 mmol/L (ref 98–110)
Creat: 0.82 mg/dL (ref 0.50–1.03)
Globulin: 2.2 g/dL (ref 1.9–3.7)
Glucose, Bld: 103 mg/dL — ABNORMAL HIGH (ref 65–99)
Potassium: 5.1 mmol/L (ref 3.5–5.3)
Sodium: 141 mmol/L (ref 135–146)
Total Bilirubin: 0.9 mg/dL (ref 0.2–1.2)
Total Protein: 6.8 g/dL (ref 6.1–8.1)
eGFR: 84 mL/min/1.73m2 (ref >=60)

## 2024-01-17 LAB — LIPID PANEL
Cholesterol: 222 mg/dL — ABNORMAL HIGH (ref <200)
HDL: 92 mg/dL (ref >=50)
LDL Cholesterol (Calc): 116 mg/dL — ABNORMAL HIGH
Non-HDL Cholesterol (Calc): 130 mg/dL — ABNORMAL HIGH (ref <130)
Total CHOL/HDL Ratio: 2.4 (calc) (ref <5.0)
Triglycerides: 53 mg/dL (ref <150)

## 2024-01-21 LAB — CYTOLOGY - PAP
Comment: NEGATIVE
Diagnosis: NEGATIVE
High risk HPV: NEGATIVE

## 2024-01-22 ENCOUNTER — Ambulatory Visit: Payer: Self-pay | Admitting: Nurse Practitioner

## 2024-02-04 ENCOUNTER — Other Ambulatory Visit (HOSPITAL_BASED_OUTPATIENT_CLINIC_OR_DEPARTMENT_OTHER): Payer: Self-pay

## 2024-02-04 MED ORDER — PROGESTERONE MICRONIZED 100 MG PO CAPS
100.0000 mg | ORAL_CAPSULE | Freq: Every day | ORAL | 4 refills | Status: AC
  Filled 2024-02-04: qty 30, 30d supply, fill #0
  Filled 2024-03-03: qty 30, 30d supply, fill #1
  Filled 2024-04-05: qty 30, 30d supply, fill #2
  Filled 2024-05-07: qty 30, 30d supply, fill #3

## 2024-02-13 ENCOUNTER — Ambulatory Visit
Admission: RE | Admit: 2024-02-13 | Discharge: 2024-02-13 | Disposition: A | Discharge status: Home / Self Care | Source: Ambulatory Visit | Attending: Nurse Practitioner | Admitting: Nurse Practitioner

## 2024-02-13 DIAGNOSIS — Z0000 Encounter for general adult medical examination without abnormal findings: Secondary | ICD-10-CM

## 2024-03-04 ENCOUNTER — Other Ambulatory Visit (HOSPITAL_BASED_OUTPATIENT_CLINIC_OR_DEPARTMENT_OTHER): Payer: Self-pay

## 2024-03-13 ENCOUNTER — Other Ambulatory Visit: Payer: Self-pay | Admitting: Nurse Practitioner

## 2024-03-13 DIAGNOSIS — F419 Anxiety disorder, unspecified: Secondary | ICD-10-CM

## 2024-03-13 NOTE — Telephone Encounter (Signed)
 Med refill request: alprazolam  0.25 mg Last AEX: 01/16/24 Next AEX: 01/18/25 Last MMG (if hormonal med) n/a Last refill: 01/16/2024  DX: anxiety Refill authorized: alprazolam  0.25 mg #30, zero refills. Sent to provider for approval or denial.

## 2024-03-23 ENCOUNTER — Encounter: Payer: Self-pay | Admitting: Nurse Practitioner

## 2024-03-24 NOTE — Telephone Encounter (Signed)
 Form completed and printed to front nurses station to be signed and faxed.   Cc: Joy

## 2024-04-06 ENCOUNTER — Other Ambulatory Visit: Payer: Self-pay

## 2024-04-07 ENCOUNTER — Other Ambulatory Visit (HOSPITAL_BASED_OUTPATIENT_CLINIC_OR_DEPARTMENT_OTHER): Payer: Self-pay

## 2025-01-18 ENCOUNTER — Ambulatory Visit: Admitting: Nurse Practitioner
